# Patient Record
Sex: Male | Born: 1940 | Race: White | Hispanic: No | Marital: Married | Smoking: Former smoker
Health system: Southern US, Community
[De-identification: ages and names within clinical notes are randomized; demographics above are authoritative.]

## PROBLEM LIST (undated history)

## (undated) DIAGNOSIS — R413 Other amnesia: Secondary | ICD-10-CM

## (undated) DIAGNOSIS — Z87442 Personal history of urinary calculi: Secondary | ICD-10-CM

## (undated) DIAGNOSIS — L219 Seborrheic dermatitis, unspecified: Secondary | ICD-10-CM

## (undated) DIAGNOSIS — I10 Essential (primary) hypertension: Secondary | ICD-10-CM

## (undated) DIAGNOSIS — D75839 Thrombocytosis, unspecified: Secondary | ICD-10-CM

## (undated) DIAGNOSIS — D473 Essential (hemorrhagic) thrombocythemia: Secondary | ICD-10-CM

## (undated) DIAGNOSIS — J90 Pleural effusion, not elsewhere classified: Secondary | ICD-10-CM

## (undated) HISTORY — DX: Essential (primary) hypertension: I10

## (undated) HISTORY — PX: TONSILLECTOMY: SUR1361

## (undated) HISTORY — DX: Thrombocytosis, unspecified: D75.839

## (undated) HISTORY — DX: Seborrheic dermatitis, unspecified: L21.9

## (undated) HISTORY — PX: KIDNEY STONE SURGERY: SHX686

## (undated) HISTORY — DX: Essential (hemorrhagic) thrombocythemia: D47.3

## (undated) HISTORY — DX: Other amnesia: R41.3

---

## 1993-03-09 HISTORY — PX: COLECTOMY: SHX59

## 2009-07-17 ENCOUNTER — Encounter: Admission: RE | Admit: 2009-07-17 | Discharge: 2009-07-17 | Payer: Self-pay | Admitting: Family Medicine

## 2009-09-12 ENCOUNTER — Ambulatory Visit: Payer: Self-pay | Admitting: Hematology and Oncology

## 2009-09-17 LAB — CBC WITH DIFFERENTIAL/PLATELET
Basophils Absolute: 0 10*3/uL (ref 0.0–0.1)
Eosinophils Absolute: 0.1 10*3/uL (ref 0.0–0.5)
HGB: 15.3 g/dL (ref 13.0–17.1)
LYMPH%: 15.7 % (ref 14.0–49.0)
MCV: 89.1 fL (ref 79.3–98.0)
MONO%: 5.2 % (ref 0.0–14.0)
NEUT#: 7.6 10*3/uL — ABNORMAL HIGH (ref 1.5–6.5)
Platelets: 907 10*3/uL — ABNORMAL HIGH (ref 140–400)

## 2009-09-20 LAB — COMPREHENSIVE METABOLIC PANEL
AST: 18 U/L (ref 0–37)
Albumin: 4.6 g/dL (ref 3.5–5.2)
BUN: 17 mg/dL (ref 6–23)
Calcium: 10.3 mg/dL (ref 8.4–10.5)
Chloride: 103 mEq/L (ref 96–112)
Potassium: 4.5 mEq/L (ref 3.5–5.3)
Sodium: 140 mEq/L (ref 135–145)
Total Protein: 7.8 g/dL (ref 6.0–8.3)

## 2009-09-20 LAB — PROTEIN ELECTROPHORESIS, SERUM
Albumin ELP: 58 % (ref 55.8–66.1)
Alpha-1-Globulin: 4.1 % (ref 2.9–4.9)
Alpha-2-Globulin: 8.9 % (ref 7.1–11.8)
Beta Globulin: 6.8 % (ref 4.7–7.2)
Total Protein, Serum Electrophoresis: 7.8 g/dL (ref 6.0–8.3)

## 2009-09-20 LAB — IRON AND TIBC: UIBC: 242 ug/dL

## 2009-09-20 LAB — BCR/ABL (LIO MMD)

## 2009-09-26 ENCOUNTER — Other Ambulatory Visit: Admission: RE | Admit: 2009-09-26 | Discharge: 2009-09-26 | Payer: Self-pay | Admitting: Hematology and Oncology

## 2009-10-01 ENCOUNTER — Ambulatory Visit (HOSPITAL_COMMUNITY): Admission: RE | Admit: 2009-10-01 | Discharge: 2009-10-01 | Payer: Self-pay | Admitting: Hematology and Oncology

## 2009-10-03 LAB — CBC WITH DIFFERENTIAL/PLATELET
Basophils Absolute: 0 10*3/uL (ref 0.0–0.1)
Eosinophils Absolute: 0.3 10*3/uL (ref 0.0–0.5)
HCT: 40.9 % (ref 38.4–49.9)
HGB: 14.1 g/dL (ref 13.0–17.1)
LYMPH%: 24.3 % (ref 14.0–49.0)
MCV: 88.7 fL (ref 79.3–98.0)
MONO#: 0.6 10*3/uL (ref 0.1–0.9)
MONO%: 7 % (ref 0.0–14.0)
NEUT#: 5 10*3/uL (ref 1.5–6.5)
NEUT%: 64.3 % (ref 39.0–75.0)
Platelets: 857 10*3/uL — ABNORMAL HIGH (ref 140–400)

## 2009-10-10 LAB — CBC WITH DIFFERENTIAL/PLATELET
BASO%: 0.2 % (ref 0.0–2.0)
Basophils Absolute: 0 10*3/uL (ref 0.0–0.1)
EOS%: 0.9 % (ref 0.0–7.0)
Eosinophils Absolute: 0.1 10*3/uL (ref 0.0–0.5)
HCT: 40.7 % (ref 38.4–49.9)
HGB: 14.1 g/dL (ref 13.0–17.1)
LYMPH%: 26.6 % (ref 14.0–49.0)
MCH: 30.7 pg (ref 27.2–33.4)
MCHC: 34.7 g/dL (ref 32.0–36.0)
MCV: 88.6 fL (ref 79.3–98.0)
MONO#: 0.2 10*3/uL (ref 0.1–0.9)
MONO%: 2.5 % (ref 0.0–14.0)
NEUT#: 4.5 10*3/uL (ref 1.5–6.5)
NEUT%: 69.8 % (ref 39.0–75.0)
Platelets: 771 10*3/uL — ABNORMAL HIGH (ref 140–400)
RBC: 4.59 10*6/uL (ref 4.20–5.82)
RDW: 13.1 % (ref 11.0–14.6)
WBC: 6.4 10*3/uL (ref 4.0–10.3)
lymph#: 1.7 10*3/uL (ref 0.9–3.3)

## 2009-10-10 LAB — BASIC METABOLIC PANEL
Chloride: 105 mEq/L (ref 96–112)
Potassium: 3.7 mEq/L (ref 3.5–5.3)
Sodium: 140 mEq/L (ref 135–145)

## 2009-11-12 ENCOUNTER — Ambulatory Visit: Payer: Self-pay | Admitting: Hematology and Oncology

## 2009-11-13 LAB — CBC WITH DIFFERENTIAL/PLATELET
BASO%: 0.6 % (ref 0.0–2.0)
Basophils Absolute: 0 10*3/uL (ref 0.0–0.1)
EOS%: 0.9 % (ref 0.0–7.0)
Eosinophils Absolute: 0.1 10*3/uL (ref 0.0–0.5)
HCT: 39.8 % (ref 38.4–49.9)
HGB: 13.7 g/dL (ref 13.0–17.1)
LYMPH%: 27.6 % (ref 14.0–49.0)
MCH: 32.6 pg (ref 27.2–33.4)
MCHC: 34.4 g/dL (ref 32.0–36.0)
MCV: 94.8 fL (ref 79.3–98.0)
MONO#: 0.4 10*3/uL (ref 0.1–0.9)
MONO%: 5.6 % (ref 0.0–14.0)
NEUT#: 4.3 10*3/uL (ref 1.5–6.5)
NEUT%: 65.3 % (ref 39.0–75.0)
Platelets: 499 10*3/uL — ABNORMAL HIGH (ref 140–400)
RBC: 4.2 10*6/uL (ref 4.20–5.82)
RDW: 13.3 % (ref 11.0–14.6)
WBC: 6.6 10*3/uL (ref 4.0–10.3)
lymph#: 1.8 10*3/uL (ref 0.9–3.3)

## 2009-11-13 LAB — COMPREHENSIVE METABOLIC PANEL
ALT: 10 U/L (ref 0–53)
AST: 15 U/L (ref 0–37)
Albumin: 4.4 g/dL (ref 3.5–5.2)
Alkaline Phosphatase: 81 U/L (ref 39–117)
BUN: 18 mg/dL (ref 6–23)
CO2: 29 mEq/L (ref 19–32)
Calcium: 9.3 mg/dL (ref 8.4–10.5)
Chloride: 104 mEq/L (ref 96–112)
Creatinine, Ser: 1.19 mg/dL (ref 0.40–1.50)
Glucose, Bld: 97 mg/dL (ref 70–99)
Potassium: 4.3 mEq/L (ref 3.5–5.3)
Sodium: 140 mEq/L (ref 135–145)
Total Bilirubin: 0.6 mg/dL (ref 0.3–1.2)
Total Protein: 7.4 g/dL (ref 6.0–8.3)

## 2009-11-29 LAB — CBC WITH DIFFERENTIAL/PLATELET
BASO%: 0.5 % (ref 0.0–2.0)
Basophils Absolute: 0 10*3/uL (ref 0.0–0.1)
EOS%: 1.3 % (ref 0.0–7.0)
Eosinophils Absolute: 0.1 10*3/uL (ref 0.0–0.5)
HCT: 38.6 % (ref 38.4–49.9)
HGB: 13.3 g/dL (ref 13.0–17.1)
LYMPH%: 19.1 % (ref 14.0–49.0)
MCH: 33.6 pg — ABNORMAL HIGH (ref 27.2–33.4)
MCHC: 34.5 g/dL (ref 32.0–36.0)
MCV: 97.6 fL (ref 79.3–98.0)
MONO#: 0.3 10*3/uL (ref 0.1–0.9)
MONO%: 4.4 % (ref 0.0–14.0)
NEUT#: 5.3 10*3/uL (ref 1.5–6.5)
NEUT%: 74.7 % (ref 39.0–75.0)
Platelets: 570 10*3/uL — ABNORMAL HIGH (ref 140–400)
RBC: 3.95 10*6/uL — ABNORMAL LOW (ref 4.20–5.82)
RDW: 24.1 % — ABNORMAL HIGH (ref 11.0–14.6)
WBC: 7.1 10*3/uL (ref 4.0–10.3)
lymph#: 1.4 10*3/uL (ref 0.9–3.3)

## 2010-01-15 ENCOUNTER — Other Ambulatory Visit: Payer: Self-pay | Admitting: Hematology and Oncology

## 2010-01-15 LAB — BASIC METABOLIC PANEL
BUN: 14 mg/dL (ref 6–23)
Chloride: 104 mEq/L (ref 96–112)
Creatinine, Ser: 1.04 mg/dL (ref 0.40–1.50)
Glucose, Bld: 118 mg/dL — ABNORMAL HIGH (ref 70–99)
Potassium: 3.8 mEq/L (ref 3.5–5.3)

## 2010-01-15 LAB — CBC WITH DIFFERENTIAL/PLATELET
Basophils Absolute: 0 10*3/uL (ref 0.0–0.1)
Eosinophils Absolute: 0.1 10*3/uL (ref 0.0–0.5)
HCT: 39.1 % (ref 38.4–49.9)
HGB: 13.7 g/dL (ref 13.0–17.1)
MCH: 37.1 pg — ABNORMAL HIGH (ref 27.2–33.4)
MCV: 106.2 fL — ABNORMAL HIGH (ref 79.3–98.0)
NEUT#: 5.5 10*3/uL (ref 1.5–6.5)
NEUT%: 74.8 % (ref 39.0–75.0)
RDW: 17.3 % — ABNORMAL HIGH (ref 11.0–14.6)
lymph#: 1.4 10*3/uL (ref 0.9–3.3)

## 2010-02-05 ENCOUNTER — Other Ambulatory Visit: Payer: Self-pay | Admitting: Hematology and Oncology

## 2010-02-05 LAB — CBC WITH DIFFERENTIAL/PLATELET
BASO%: 0.5 % (ref 0.0–2.0)
EOS%: 1.5 % (ref 0.0–7.0)
Eosinophils Absolute: 0.1 10*3/uL (ref 0.0–0.5)
LYMPH%: 27.3 % (ref 14.0–49.0)
MCH: 37.8 pg — ABNORMAL HIGH (ref 27.2–33.4)
MCHC: 34.6 g/dL (ref 32.0–36.0)
MCV: 109.1 fL — ABNORMAL HIGH (ref 79.3–98.0)
MONO%: 6.7 % (ref 0.0–14.0)
Platelets: 493 10*3/uL — ABNORMAL HIGH (ref 140–400)
RBC: 3.58 10*6/uL — ABNORMAL LOW (ref 4.20–5.82)
RDW: 12.8 % (ref 11.0–14.6)

## 2010-03-17 ENCOUNTER — Ambulatory Visit: Payer: Self-pay | Admitting: Hematology and Oncology

## 2010-03-19 LAB — BASIC METABOLIC PANEL
BUN: 26 mg/dL — ABNORMAL HIGH (ref 6–23)
CO2: 30 mEq/L (ref 19–32)
Calcium: 10 mg/dL (ref 8.4–10.5)
Chloride: 106 mEq/L (ref 96–112)
Creatinine, Ser: 1.35 mg/dL (ref 0.40–1.50)
Glucose, Bld: 101 mg/dL — ABNORMAL HIGH (ref 70–99)
Potassium: 4.1 mEq/L (ref 3.5–5.3)
Sodium: 144 mEq/L (ref 135–145)

## 2010-03-19 LAB — CBC WITH DIFFERENTIAL/PLATELET
BASO%: 0.6 % (ref 0.0–2.0)
Basophils Absolute: 0 10*3/uL (ref 0.0–0.1)
EOS%: 1.4 % (ref 0.0–7.0)
Eosinophils Absolute: 0.1 10*3/uL (ref 0.0–0.5)
HCT: 40.8 % (ref 38.4–49.9)
HGB: 14 g/dL (ref 13.0–17.1)
LYMPH%: 32.2 % (ref 14.0–49.0)
MCH: 37 pg — ABNORMAL HIGH (ref 27.2–33.4)
MCHC: 34.3 g/dL (ref 32.0–36.0)
MCV: 107.7 fL — ABNORMAL HIGH (ref 79.3–98.0)
MONO#: 0.5 10*3/uL (ref 0.1–0.9)
MONO%: 7.3 % (ref 0.0–14.0)
NEUT#: 3.7 10*3/uL (ref 1.5–6.5)
NEUT%: 58.5 % (ref 39.0–75.0)
Platelets: 499 10*3/uL — ABNORMAL HIGH (ref 140–400)
RBC: 3.79 10*6/uL — ABNORMAL LOW (ref 4.20–5.82)
RDW: 13 % (ref 11.0–14.6)
WBC: 6.2 10*3/uL (ref 4.0–10.3)
lymph#: 2 10*3/uL (ref 0.9–3.3)

## 2010-04-16 ENCOUNTER — Encounter (HOSPITAL_BASED_OUTPATIENT_CLINIC_OR_DEPARTMENT_OTHER): Payer: MEDICARE | Admitting: Hematology and Oncology

## 2010-04-16 ENCOUNTER — Other Ambulatory Visit: Payer: Self-pay | Admitting: Hematology and Oncology

## 2010-04-16 DIAGNOSIS — D47Z9 Other specified neoplasms of uncertain behavior of lymphoid, hematopoietic and related tissue: Secondary | ICD-10-CM

## 2010-04-16 DIAGNOSIS — D473 Essential (hemorrhagic) thrombocythemia: Secondary | ICD-10-CM

## 2010-04-16 LAB — CBC WITH DIFFERENTIAL/PLATELET
BASO%: 0.1 % (ref 0.0–2.0)
Basophils Absolute: 0 10*3/uL (ref 0.0–0.1)
EOS%: 0.4 % (ref 0.0–7.0)
Eosinophils Absolute: 0 10*3/uL (ref 0.0–0.5)
HCT: 38.7 % (ref 38.4–49.9)
HGB: 13.2 g/dL (ref 13.0–17.1)
LYMPH%: 18.1 % (ref 14.0–49.0)
MCH: 37 pg — ABNORMAL HIGH (ref 27.2–33.4)
MCHC: 34.1 g/dL (ref 32.0–36.0)
MCV: 108.6 fL — ABNORMAL HIGH (ref 79.3–98.0)
MONO#: 0.4 10*3/uL (ref 0.1–0.9)
MONO%: 4.9 % (ref 0.0–14.0)
NEUT#: 5.7 10*3/uL (ref 1.5–6.5)
NEUT%: 76.5 % — ABNORMAL HIGH (ref 39.0–75.0)
Platelets: 325 10*3/uL (ref 140–400)
RBC: 3.56 10*6/uL — ABNORMAL LOW (ref 4.20–5.82)
RDW: 14.4 % (ref 11.0–14.6)
WBC: 7.5 10*3/uL (ref 4.0–10.3)
lymph#: 1.3 10*3/uL (ref 0.9–3.3)

## 2010-05-24 LAB — DIFFERENTIAL
Basophils Absolute: 0.2 10*3/uL — ABNORMAL HIGH (ref 0.0–0.1)
Eosinophils Relative: 4 % (ref 0–5)
Lymphocytes Relative: 25 % (ref 12–46)
Neutrophils Relative %: 62 % (ref 43–77)

## 2010-05-24 LAB — BONE MARROW EXAM

## 2010-05-24 LAB — CBC
MCV: 90 fL (ref 78.0–100.0)
Platelets: 851 10*3/uL — ABNORMAL HIGH (ref 150–400)
RDW: 13.3 % (ref 11.5–15.5)
WBC: 7.9 10*3/uL (ref 4.0–10.5)

## 2010-05-24 LAB — CHROMOSOME ANALYSIS, BONE MARROW

## 2010-06-18 ENCOUNTER — Other Ambulatory Visit: Payer: Self-pay | Admitting: Hematology and Oncology

## 2010-06-18 ENCOUNTER — Encounter (HOSPITAL_BASED_OUTPATIENT_CLINIC_OR_DEPARTMENT_OTHER): Payer: MEDICARE | Admitting: Hematology and Oncology

## 2010-06-18 DIAGNOSIS — D473 Essential (hemorrhagic) thrombocythemia: Secondary | ICD-10-CM

## 2010-06-18 DIAGNOSIS — D47Z9 Other specified neoplasms of uncertain behavior of lymphoid, hematopoietic and related tissue: Secondary | ICD-10-CM

## 2010-06-18 LAB — BASIC METABOLIC PANEL
BUN: 21 mg/dL (ref 6–23)
Chloride: 105 mEq/L (ref 96–112)
Potassium: 3.8 mEq/L (ref 3.5–5.3)
Sodium: 140 mEq/L (ref 135–145)

## 2010-06-18 LAB — CBC WITH DIFFERENTIAL/PLATELET
Basophils Absolute: 0 10*3/uL (ref 0.0–0.1)
EOS%: 1 % (ref 0.0–7.0)
HGB: 12.5 g/dL — ABNORMAL LOW (ref 13.0–17.1)
MCH: 39.8 pg — ABNORMAL HIGH (ref 27.2–33.4)
NEUT#: 3 10*3/uL (ref 1.5–6.5)
RBC: 3.13 10*6/uL — ABNORMAL LOW (ref 4.20–5.82)
RDW: 14.3 % (ref 11.0–14.6)
lymph#: 1.2 10*3/uL (ref 0.9–3.3)

## 2010-07-30 ENCOUNTER — Other Ambulatory Visit: Payer: Self-pay | Admitting: Hematology and Oncology

## 2010-07-30 ENCOUNTER — Encounter (HOSPITAL_BASED_OUTPATIENT_CLINIC_OR_DEPARTMENT_OTHER): Payer: Medicare Other | Admitting: Hematology and Oncology

## 2010-07-30 DIAGNOSIS — D473 Essential (hemorrhagic) thrombocythemia: Secondary | ICD-10-CM

## 2010-07-30 DIAGNOSIS — D47Z9 Other specified neoplasms of uncertain behavior of lymphoid, hematopoietic and related tissue: Secondary | ICD-10-CM

## 2010-07-30 LAB — CBC WITH DIFFERENTIAL/PLATELET
Basophils Absolute: 0 10*3/uL (ref 0.0–0.1)
Eosinophils Absolute: 0.1 10*3/uL (ref 0.0–0.5)
HCT: 36.9 % — ABNORMAL LOW (ref 38.4–49.9)
HGB: 12.9 g/dL — ABNORMAL LOW (ref 13.0–17.1)
LYMPH%: 29.6 % (ref 14.0–49.0)
MCV: 113 fL — ABNORMAL HIGH (ref 79.3–98.0)
MONO#: 0.4 10*3/uL (ref 0.1–0.9)
MONO%: 7 % (ref 0.0–14.0)
NEUT#: 3.5 10*3/uL (ref 1.5–6.5)
NEUT%: 61.7 % (ref 39.0–75.0)
Platelets: 307 10*3/uL (ref 140–400)
WBC: 5.6 10*3/uL (ref 4.0–10.3)

## 2010-08-27 ENCOUNTER — Other Ambulatory Visit: Payer: Self-pay | Admitting: Hematology and Oncology

## 2010-08-27 ENCOUNTER — Encounter (HOSPITAL_BASED_OUTPATIENT_CLINIC_OR_DEPARTMENT_OTHER): Payer: Medicare Other | Admitting: Hematology and Oncology

## 2010-08-27 DIAGNOSIS — D47Z9 Other specified neoplasms of uncertain behavior of lymphoid, hematopoietic and related tissue: Secondary | ICD-10-CM

## 2010-08-27 DIAGNOSIS — D473 Essential (hemorrhagic) thrombocythemia: Secondary | ICD-10-CM

## 2010-08-27 LAB — CBC WITH DIFFERENTIAL/PLATELET
BASO%: 0.5 % (ref 0.0–2.0)
HCT: 37.9 % — ABNORMAL LOW (ref 38.4–49.9)
LYMPH%: 23.9 % (ref 14.0–49.0)
MCHC: 34.6 g/dL (ref 32.0–36.0)
MCV: 112.1 fL — ABNORMAL HIGH (ref 79.3–98.0)
MONO#: 0.4 10*3/uL (ref 0.1–0.9)
MONO%: 7.7 % (ref 0.0–14.0)
NEUT%: 67.3 % (ref 39.0–75.0)
Platelets: 304 10*3/uL (ref 140–400)
RBC: 3.38 10*6/uL — ABNORMAL LOW (ref 4.20–5.82)

## 2010-09-23 ENCOUNTER — Other Ambulatory Visit: Payer: Self-pay | Admitting: Hematology and Oncology

## 2010-09-23 ENCOUNTER — Encounter (HOSPITAL_BASED_OUTPATIENT_CLINIC_OR_DEPARTMENT_OTHER): Payer: Medicare Other | Admitting: Hematology and Oncology

## 2010-09-23 DIAGNOSIS — D473 Essential (hemorrhagic) thrombocythemia: Secondary | ICD-10-CM

## 2010-09-23 DIAGNOSIS — D47Z9 Other specified neoplasms of uncertain behavior of lymphoid, hematopoietic and related tissue: Secondary | ICD-10-CM

## 2010-09-23 LAB — CBC WITH DIFFERENTIAL/PLATELET
BASO%: 0.3 % (ref 0.0–2.0)
EOS%: 0.7 % (ref 0.0–7.0)
HCT: 36.6 % — ABNORMAL LOW (ref 38.4–49.9)
LYMPH%: 24.4 % (ref 14.0–49.0)
MCH: 39.3 pg — ABNORMAL HIGH (ref 27.2–33.4)
MCHC: 34.7 g/dL (ref 32.0–36.0)
MONO#: 0.4 10*3/uL (ref 0.1–0.9)
NEUT%: 68 % (ref 39.0–75.0)
Platelets: 266 10*3/uL (ref 140–400)
RBC: 3.23 10*6/uL — ABNORMAL LOW (ref 4.20–5.82)
WBC: 5.8 10*3/uL (ref 4.0–10.3)
lymph#: 1.4 10*3/uL (ref 0.9–3.3)

## 2010-09-23 LAB — BASIC METABOLIC PANEL
Calcium: 9.2 mg/dL (ref 8.4–10.5)
Creatinine, Ser: 1.21 mg/dL (ref 0.50–1.35)
Sodium: 140 mEq/L (ref 135–145)

## 2010-10-29 ENCOUNTER — Encounter (HOSPITAL_BASED_OUTPATIENT_CLINIC_OR_DEPARTMENT_OTHER): Payer: Medicare Other | Admitting: Hematology and Oncology

## 2010-10-29 ENCOUNTER — Other Ambulatory Visit: Payer: Self-pay | Admitting: Hematology and Oncology

## 2010-10-29 DIAGNOSIS — D47Z9 Other specified neoplasms of uncertain behavior of lymphoid, hematopoietic and related tissue: Secondary | ICD-10-CM

## 2010-10-29 DIAGNOSIS — D473 Essential (hemorrhagic) thrombocythemia: Secondary | ICD-10-CM

## 2010-10-29 LAB — CBC WITH DIFFERENTIAL/PLATELET
Basophils Absolute: 0 10*3/uL (ref 0.0–0.1)
Eosinophils Absolute: 0 10*3/uL (ref 0.0–0.5)
HCT: 37.1 % — ABNORMAL LOW (ref 38.4–49.9)
HGB: 13 g/dL (ref 13.0–17.1)
MONO#: 0.4 10*3/uL (ref 0.1–0.9)
NEUT#: 2.8 10*3/uL (ref 1.5–6.5)
NEUT%: 58.5 % (ref 39.0–75.0)
RDW: 13.6 % (ref 11.0–14.6)
lymph#: 1.5 10*3/uL (ref 0.9–3.3)

## 2010-12-23 ENCOUNTER — Other Ambulatory Visit: Payer: Self-pay | Admitting: Hematology and Oncology

## 2010-12-23 ENCOUNTER — Encounter (HOSPITAL_BASED_OUTPATIENT_CLINIC_OR_DEPARTMENT_OTHER): Payer: Medicare Other | Admitting: Hematology and Oncology

## 2010-12-23 DIAGNOSIS — D473 Essential (hemorrhagic) thrombocythemia: Secondary | ICD-10-CM

## 2010-12-23 DIAGNOSIS — D47Z9 Other specified neoplasms of uncertain behavior of lymphoid, hematopoietic and related tissue: Secondary | ICD-10-CM

## 2010-12-23 DIAGNOSIS — D649 Anemia, unspecified: Secondary | ICD-10-CM

## 2010-12-23 LAB — CBC WITH DIFFERENTIAL/PLATELET
Basophils Absolute: 0 10*3/uL (ref 0.0–0.1)
Eosinophils Absolute: 0.1 10*3/uL (ref 0.0–0.5)
HCT: 37.1 % — ABNORMAL LOW (ref 38.4–49.9)
HGB: 12.9 g/dL — ABNORMAL LOW (ref 13.0–17.1)
MCV: 112.3 fL — ABNORMAL HIGH (ref 79.3–98.0)
MONO%: 8.9 % (ref 0.0–14.0)
NEUT#: 3.5 10*3/uL (ref 1.5–6.5)
NEUT%: 66.7 % (ref 39.0–75.0)
Platelets: 295 10*3/uL (ref 140–400)
RDW: 12.6 % (ref 11.0–14.6)

## 2010-12-23 LAB — BASIC METABOLIC PANEL
BUN: 19 mg/dL (ref 6–23)
Creatinine, Ser: 1.18 mg/dL (ref 0.50–1.35)
Glucose, Bld: 114 mg/dL — ABNORMAL HIGH (ref 70–99)
Potassium: 4 mEq/L (ref 3.5–5.3)

## 2011-02-04 ENCOUNTER — Other Ambulatory Visit: Payer: Medicare Other | Admitting: Lab

## 2011-02-04 ENCOUNTER — Telehealth: Payer: Self-pay | Admitting: Hematology and Oncology

## 2011-02-04 NOTE — Telephone Encounter (Signed)
Pt called today to r/s 11/28 lb to 12/5 - date per pt. Pt also given jan appts for 1/16.

## 2011-02-11 ENCOUNTER — Other Ambulatory Visit: Payer: Self-pay | Admitting: Hematology and Oncology

## 2011-02-11 ENCOUNTER — Other Ambulatory Visit (HOSPITAL_BASED_OUTPATIENT_CLINIC_OR_DEPARTMENT_OTHER): Payer: Medicare Other

## 2011-02-11 ENCOUNTER — Telehealth: Payer: Self-pay | Admitting: *Deleted

## 2011-02-11 DIAGNOSIS — D473 Essential (hemorrhagic) thrombocythemia: Secondary | ICD-10-CM

## 2011-02-11 LAB — CBC WITH DIFFERENTIAL/PLATELET
EOS%: 0.6 % (ref 0.0–7.0)
MCHC: 34.8 g/dL (ref 32.0–36.0)
MCV: 113.6 fL — ABNORMAL HIGH (ref 79.3–98.0)
MONO#: 0.7 10*3/uL (ref 0.1–0.9)
Platelets: 303 10*3/uL (ref 140–400)
RBC: 3.24 10*6/uL — ABNORMAL LOW (ref 4.20–5.82)
WBC: 7.8 10*3/uL (ref 4.0–10.3)

## 2011-02-11 NOTE — Telephone Encounter (Signed)
Dr. Dalene Carrow reviewed lab results today.   Spoke with pt and instructed pt to continue with  Hydrea  1000 mg  Po daily as per md.    Confirmed date and time for lab and f/u appt on  03/25/11.     Pt voiced understanding.

## 2011-03-23 ENCOUNTER — Encounter: Payer: Self-pay | Admitting: *Deleted

## 2011-03-25 ENCOUNTER — Other Ambulatory Visit (HOSPITAL_BASED_OUTPATIENT_CLINIC_OR_DEPARTMENT_OTHER): Payer: Medicare Other | Admitting: Lab

## 2011-03-25 ENCOUNTER — Ambulatory Visit (HOSPITAL_BASED_OUTPATIENT_CLINIC_OR_DEPARTMENT_OTHER): Payer: Medicare Other | Admitting: Hematology and Oncology

## 2011-03-25 ENCOUNTER — Telehealth: Payer: Self-pay | Admitting: Hematology and Oncology

## 2011-03-25 VITALS — BP 153/75 | HR 80 | Temp 98.3°F | Ht 70.5 in | Wt 209.6 lb

## 2011-03-25 DIAGNOSIS — D473 Essential (hemorrhagic) thrombocythemia: Secondary | ICD-10-CM

## 2011-03-25 LAB — CBC WITH DIFFERENTIAL/PLATELET
Basophils Absolute: 0 10*3/uL (ref 0.0–0.1)
EOS%: 0.9 % (ref 0.0–7.0)
Eosinophils Absolute: 0 10*3/uL (ref 0.0–0.5)
HCT: 38.5 % (ref 38.4–49.9)
HGB: 13.5 g/dL (ref 13.0–17.1)
LYMPH%: 25.8 % (ref 14.0–49.0)
MCH: 39.7 pg — ABNORMAL HIGH (ref 27.2–33.4)
MCV: 113.6 fL — ABNORMAL HIGH (ref 79.3–98.0)
MONO%: 6 % (ref 0.0–14.0)
NEUT#: 3.6 10*3/uL (ref 1.5–6.5)
NEUT%: 66.9 % (ref 39.0–75.0)
Platelets: 307 10*3/uL (ref 140–400)
RDW: 13.6 % (ref 11.0–14.6)

## 2011-03-25 LAB — BASIC METABOLIC PANEL
BUN: 15 mg/dL (ref 6–23)
Calcium: 9.5 mg/dL (ref 8.4–10.5)
Creatinine, Ser: 1.15 mg/dL (ref 0.50–1.35)
Glucose, Bld: 114 mg/dL — ABNORMAL HIGH (ref 70–99)

## 2011-03-25 NOTE — Progress Notes (Signed)
This office note has been dictated.

## 2011-03-25 NOTE — Telephone Encounter (Signed)
Gv pt appt for march-july2013

## 2011-03-25 NOTE — Progress Notes (Signed)
CC:   Anna Genre. Little, M.D.  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  INTERVAL HISTORY:  Jordan Potts has had labs drawn monthly since he was last seen in October and essentially he has had no changes to his blood counts.  He is tolerating Hydrea with very minimal difficulties.  Today's CBC on 03/25/2011 notes a white cell count of 5.3, hemoglobin 13.5, hematocrit 38.5, platelets 307.  MEDICATIONS:  Hydrea 1000 mg daily.  PHYSICAL EXAMINATION:  General:  Alert and oriented x3.  Vitals:  Pulse 80, blood pressure 153/75, temperature 98.3, respirations 20, weight 209 pounds.  HEENT:  Head is atraumatic, normocephalic.  Sclerae anicteric. Mouth:  No thrush.  Chest:  Clear.  CVS:  Unremarkable.  Abdomen:  Soft and nontender.  Bowel sounds present.  Extremities:  No edema.  Pulses present and symmetrical.  LABORATORY DATA:  CBC as above.  BMET pending.  IMPRESSION AND PLAN:  Jordan Potts is a 71 year old man with chronic myeloproliferative disorder/essential thrombocytosis who is currently maintained on Hydrea 1000 mg p.o. daily.  He continues to demonstrate stable lab data.  He is asymptomatic.  With this said, we will space out his lab work to every 2 months and he is scheduled to follow up in 6 months' time or sooner if needed.    ______________________________ Laurice Record, M.D. LIO/MEDQ  D:  03/25/2011  T:  03/25/2011  Job:  [id number]

## 2011-05-22 ENCOUNTER — Other Ambulatory Visit (HOSPITAL_BASED_OUTPATIENT_CLINIC_OR_DEPARTMENT_OTHER): Payer: Medicare Other | Admitting: Lab

## 2011-05-22 ENCOUNTER — Telehealth: Payer: Self-pay | Admitting: Nurse Practitioner

## 2011-05-22 DIAGNOSIS — D473 Essential (hemorrhagic) thrombocythemia: Secondary | ICD-10-CM

## 2011-05-22 LAB — CBC WITH DIFFERENTIAL/PLATELET
BASO%: 0.4 % (ref 0.0–2.0)
Eosinophils Absolute: 0 10*3/uL (ref 0.0–0.5)
LYMPH%: 26 % (ref 14.0–49.0)
MCHC: 34.9 g/dL (ref 32.0–36.0)
MONO#: 0.3 10*3/uL (ref 0.1–0.9)
NEUT#: 4 10*3/uL (ref 1.5–6.5)
Platelets: 321 10*3/uL (ref 140–400)
RBC: 3.48 10*6/uL — ABNORMAL LOW (ref 4.20–5.82)
RDW: 13.2 % (ref 11.0–14.6)
WBC: 6 10*3/uL (ref 4.0–10.3)

## 2011-05-22 NOTE — Telephone Encounter (Signed)
Left message for patient to call office re: labs today.  Need to inform pt:  Continue with Hydrea 1000mg  daily. Recheck CBC as scheduled on 5/16.

## 2011-05-26 ENCOUNTER — Other Ambulatory Visit: Payer: Self-pay | Admitting: Hematology and Oncology

## 2011-05-26 DIAGNOSIS — D473 Essential (hemorrhagic) thrombocythemia: Secondary | ICD-10-CM

## 2011-07-23 ENCOUNTER — Telehealth: Payer: Self-pay | Admitting: *Deleted

## 2011-07-23 ENCOUNTER — Other Ambulatory Visit (HOSPITAL_BASED_OUTPATIENT_CLINIC_OR_DEPARTMENT_OTHER): Payer: Medicare Other | Admitting: Lab

## 2011-07-23 DIAGNOSIS — D473 Essential (hemorrhagic) thrombocythemia: Secondary | ICD-10-CM

## 2011-07-23 LAB — CBC WITH DIFFERENTIAL/PLATELET
BASO%: 0.6 % (ref 0.0–2.0)
Eosinophils Absolute: 0.1 10*3/uL (ref 0.0–0.5)
HCT: 36 % — ABNORMAL LOW (ref 38.4–49.9)
MCHC: 35.5 g/dL (ref 32.0–36.0)
MONO#: 0.4 10*3/uL (ref 0.1–0.9)
NEUT#: 3.5 10*3/uL (ref 1.5–6.5)
Platelets: 282 10*3/uL (ref 140–400)
RBC: 3.25 10*6/uL — ABNORMAL LOW (ref 4.20–5.82)
WBC: 5.5 10*3/uL (ref 4.0–10.3)
lymph#: 1.5 10*3/uL (ref 0.9–3.3)
nRBC: 0 % (ref 0–0)

## 2011-07-23 NOTE — Telephone Encounter (Signed)
Dr. Arline Asp reviewed lab results today.   Spoke with pt at home and instructed pt to continue with  Hydrea  1000 mg  Po daily  As per md.   Confirmed date and time for next f/u visit on 09/22/11.    Pt voiced understanding.

## 2011-09-22 ENCOUNTER — Other Ambulatory Visit (HOSPITAL_BASED_OUTPATIENT_CLINIC_OR_DEPARTMENT_OTHER): Payer: Medicare Other | Admitting: Lab

## 2011-09-22 ENCOUNTER — Telehealth: Payer: Self-pay | Admitting: Hematology and Oncology

## 2011-09-22 ENCOUNTER — Encounter: Payer: Self-pay | Admitting: Hematology and Oncology

## 2011-09-22 ENCOUNTER — Ambulatory Visit (HOSPITAL_BASED_OUTPATIENT_CLINIC_OR_DEPARTMENT_OTHER): Payer: Medicare Other | Admitting: Hematology and Oncology

## 2011-09-22 VITALS — BP 141/76 | HR 75 | Temp 97.2°F | Ht 70.5 in | Wt 205.1 lb

## 2011-09-22 DIAGNOSIS — N200 Calculus of kidney: Secondary | ICD-10-CM | POA: Insufficient documentation

## 2011-09-22 DIAGNOSIS — D473 Essential (hemorrhagic) thrombocythemia: Secondary | ICD-10-CM

## 2011-09-22 DIAGNOSIS — D45 Polycythemia vera: Secondary | ICD-10-CM

## 2011-09-22 LAB — CBC WITH DIFFERENTIAL/PLATELET
Basophils Absolute: 0 10*3/uL (ref 0.0–0.1)
Eosinophils Absolute: 0.1 10*3/uL (ref 0.0–0.5)
HCT: 38.6 % (ref 38.4–49.9)
HGB: 13.7 g/dL (ref 13.0–17.1)
MONO#: 0.4 10*3/uL (ref 0.1–0.9)
NEUT#: 4.1 10*3/uL (ref 1.5–6.5)
NEUT%: 65.9 % (ref 39.0–75.0)
RDW: 13.7 % (ref 11.0–14.6)
lymph#: 1.6 10*3/uL (ref 0.9–3.3)

## 2011-09-22 NOTE — Patient Instructions (Addendum)
Jordan Potts  [id number]  [facility] Discharge Instructions  RECOMMENDATIONS MADE BY THE CONSULTANT AND ANY TEST RESULTS WILL BE SENT TO YOUR REFERRING DOCTOR.   EXAM FINDINGS BY MD TODAY AND SIGNS AND SYMPTOMS TO REPORT TO CLINIC OR PRIMARY MD:   Your current list of medications are: Current Outpatient Prescriptions  Medication Sig Dispense Refill  . acetaminophen (TYLENOL) 500 MG tablet Take 1,000 mg by mouth as needed.      Marland Kitchen amLODipine (NORVASC) 5 MG tablet Take 5 mg by mouth daily.      Marland Kitchen aspirin 81 MG tablet Take 81 mg by mouth daily.      . hydroxyurea (HYDREA) 500 MG capsule TAKE TWO CAPSULES BY MOUTH EVERY DAY  60 capsule  4  . valsartan-hydrochlorothiazide (DIOVAN-HCT) 320-25 MG per tablet Take 1 tablet by mouth daily.         INSTRUCTIONS GIVEN AND DISCUSSED:   SPECIAL INSTRUCTIONS/FOLLOW-UP:  See above.  I acknowledge that I have been informed and understand all the instructions given to me and received a copy. I do not have any more questions at this time, but understand that I may call the [facility] at [phone] during business hours should I have any further questions or need assistance in obtaining follow-up care.

## 2011-09-22 NOTE — Telephone Encounter (Signed)
Gave pt appt for lab every 3 months and see MD in March 2014 with labs

## 2011-09-22 NOTE — Progress Notes (Signed)
CC:   Anna Genre. Little, M.D.  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  INTERVAL HISTORY:  Mr. Caldron was last seen 6 months ago.  He has had no issues or concerns since his last followup visit.  We have closely monitored his CBCs and we have not had to make any adjustments in Hydrea dosage.  He denies nausea, vomiting, abdominal pain, diarrhea, melena, or hematochezia.  MEDICATIONS:  Hydrea 1000 mg daily.  PHYSICAL EXAMINATION:  General:  The patient is alert and oriented x3. Vitals:  Pulse 75, blood pressure 141/76, temperature 97.2, respirations 20, weight 205.1 pounds.  HEENT:  Head is atraumatic, normocephalic. Sclerae are anicteric.  Mouth moist.  Neck:  Supple.  Chest:  Clear to percussion and auscultation.  CVS:  Unremarkable.  Abdomen:  Soft, nontender.  Bowel sounds present.  Extremities:  No edema.  LABORATORY DATA:  On 09/22/2011 white cell count 6.1, hemoglobin 13.7, hematocrit 38.6, platelets 216.  IMPRESSION AND PLAN:  Mr. Want is a 71 year old man with chronic myeloproliferative disorder/essential thrombocytosis, who is currently maintained on Hydrea 1000 mg p.o. daily.  Labs remain stable.  He remains asymptomatic.  He will schedule every 3 months for now. He follows up in 9 months' time or sooner if needed.    ______________________________ Laurice Record, M.D. LIO/MEDQ  D:  09/22/2011  T:  09/22/2011  Job:  [id number]

## 2011-09-22 NOTE — Progress Notes (Signed)
This office note has been dictated.

## 2011-10-05 ENCOUNTER — Other Ambulatory Visit: Payer: Self-pay | Admitting: *Deleted

## 2011-11-24 ENCOUNTER — Other Ambulatory Visit: Payer: Self-pay | Admitting: Hematology and Oncology

## 2011-11-24 DIAGNOSIS — D471 Chronic myeloproliferative disease: Secondary | ICD-10-CM

## 2011-12-23 ENCOUNTER — Telehealth: Payer: Self-pay | Admitting: *Deleted

## 2011-12-23 ENCOUNTER — Other Ambulatory Visit (HOSPITAL_BASED_OUTPATIENT_CLINIC_OR_DEPARTMENT_OTHER): Payer: Medicare Other | Admitting: Lab

## 2011-12-23 DIAGNOSIS — D45 Polycythemia vera: Secondary | ICD-10-CM

## 2011-12-23 LAB — CBC WITH DIFFERENTIAL/PLATELET
Basophils Absolute: 0 10*3/uL (ref 0.0–0.1)
Eosinophils Absolute: 0 10*3/uL (ref 0.0–0.5)
HCT: 38.7 % (ref 38.4–49.9)
HGB: 13.7 g/dL (ref 13.0–17.1)
LYMPH%: 29.4 % (ref 14.0–49.0)
MCV: 108.8 fL — ABNORMAL HIGH (ref 79.3–98.0)
MONO%: 6.7 % (ref 0.0–14.0)
NEUT#: 3.3 10*3/uL (ref 1.5–6.5)
NEUT%: 62.3 % (ref 39.0–75.0)
Platelets: 400 10*3/uL (ref 140–400)
RDW: 12.2 % (ref 11.0–14.6)

## 2011-12-23 NOTE — Telephone Encounter (Signed)
Called pt at home and left message on voice mail re:  Asked pt to call nurse back for further instructions from lab results today.

## 2011-12-24 ENCOUNTER — Telehealth: Payer: Self-pay | Admitting: *Deleted

## 2011-12-24 NOTE — Telephone Encounter (Signed)
Pt returned nurse call from yesterday.   Called pt and informed pt re:  Platelets stable ;  Continue with Hydrea 1000 mg  Daily as per md.    Pt has appt for lab on  03/25/11.  Pt voiced understanding.

## 2012-03-24 ENCOUNTER — Other Ambulatory Visit (HOSPITAL_BASED_OUTPATIENT_CLINIC_OR_DEPARTMENT_OTHER): Payer: Medicare Other | Admitting: Lab

## 2012-03-24 ENCOUNTER — Telehealth: Payer: Self-pay | Admitting: Oncology

## 2012-03-24 DIAGNOSIS — D45 Polycythemia vera: Secondary | ICD-10-CM

## 2012-03-24 LAB — CBC WITH DIFFERENTIAL/PLATELET
Eosinophils Absolute: 0.1 10*3/uL (ref 0.0–0.5)
LYMPH%: 24.5 % (ref 14.0–49.0)
MCH: 38 pg — ABNORMAL HIGH (ref 27.2–33.4)
MCV: 106.2 fL — ABNORMAL HIGH (ref 79.3–98.0)
MONO%: 6.8 % (ref 0.0–14.0)
NEUT#: 5.3 10*3/uL (ref 1.5–6.5)
Platelets: 303 10*3/uL (ref 140–400)
RBC: 3.65 10*6/uL — ABNORMAL LOW (ref 4.20–5.82)

## 2012-03-24 NOTE — Telephone Encounter (Signed)
Pt came in today for a lab appt and was informed about the provider change. Pt will see Annice Pih 03/20 @ 9:30.

## 2012-03-29 ENCOUNTER — Other Ambulatory Visit: Payer: Self-pay | Admitting: Hematology and Oncology

## 2012-05-24 ENCOUNTER — Telehealth: Payer: Self-pay | Admitting: Oncology

## 2012-05-24 NOTE — Telephone Encounter (Signed)
Pt called to r/s appt to 4/3 @ 9:30.  New calendar mailed.

## 2012-05-25 ENCOUNTER — Ambulatory Visit: Payer: Medicare Other | Admitting: Hematology and Oncology

## 2012-05-25 ENCOUNTER — Other Ambulatory Visit: Payer: Medicare Other | Admitting: Lab

## 2012-05-26 ENCOUNTER — Ambulatory Visit: Payer: Medicare Other | Admitting: Family

## 2012-05-26 ENCOUNTER — Other Ambulatory Visit: Payer: Medicare Other | Admitting: Lab

## 2012-06-03 ENCOUNTER — Telehealth: Payer: Self-pay | Admitting: Oncology

## 2012-06-03 NOTE — Telephone Encounter (Signed)
Former LO pt to DM. Moved [facility] 4/3 to SW 4/4. S/w pt he is aware.

## 2012-06-09 ENCOUNTER — Other Ambulatory Visit: Payer: Self-pay | Admitting: Medical Oncology

## 2012-06-09 ENCOUNTER — Other Ambulatory Visit: Payer: Self-pay | Admitting: Oncology

## 2012-06-09 ENCOUNTER — Other Ambulatory Visit: Payer: Medicare Other | Admitting: Lab

## 2012-06-09 ENCOUNTER — Ambulatory Visit: Payer: Medicare Other | Admitting: Family

## 2012-06-09 DIAGNOSIS — D473 Essential (hemorrhagic) thrombocythemia: Secondary | ICD-10-CM

## 2012-06-09 DIAGNOSIS — D45 Polycythemia vera: Secondary | ICD-10-CM

## 2012-06-10 ENCOUNTER — Other Ambulatory Visit (HOSPITAL_BASED_OUTPATIENT_CLINIC_OR_DEPARTMENT_OTHER): Payer: Medicare Other | Admitting: Lab

## 2012-06-10 ENCOUNTER — Ambulatory Visit (HOSPITAL_BASED_OUTPATIENT_CLINIC_OR_DEPARTMENT_OTHER): Payer: Medicare Other | Admitting: Physician Assistant

## 2012-06-10 ENCOUNTER — Telehealth: Payer: Self-pay | Admitting: Oncology

## 2012-06-10 VITALS — BP 152/80 | HR 73 | Temp 98.1°F | Resp 18 | Ht 70.5 in | Wt 204.9 lb

## 2012-06-10 DIAGNOSIS — D473 Essential (hemorrhagic) thrombocythemia: Secondary | ICD-10-CM

## 2012-06-10 DIAGNOSIS — D45 Polycythemia vera: Secondary | ICD-10-CM

## 2012-06-10 DIAGNOSIS — D47Z9 Other specified neoplasms of uncertain behavior of lymphoid, hematopoietic and related tissue: Secondary | ICD-10-CM

## 2012-06-10 LAB — COMPREHENSIVE METABOLIC PANEL (CC13)
ALT: 22 U/L (ref 0–55)
Alkaline Phosphatase: 80 U/L (ref 40–150)
Sodium: 142 mEq/L (ref 136–145)
Total Bilirubin: 0.71 mg/dL (ref 0.20–1.20)
Total Protein: 7.1 g/dL (ref 6.4–8.3)

## 2012-06-10 LAB — CBC WITH DIFFERENTIAL/PLATELET
BASO%: 0.5 % (ref 0.0–2.0)
Basophils Absolute: 0 10*3/uL (ref 0.0–0.1)
EOS%: 0.7 % (ref 0.0–7.0)
Eosinophils Absolute: 0 10*3/uL (ref 0.0–0.5)
HCT: 39.5 % (ref 38.4–49.9)
HGB: 13.7 g/dL (ref 13.0–17.1)
LYMPH%: 30.5 % (ref 14.0–49.0)
MCH: 37.5 pg — ABNORMAL HIGH (ref 27.2–33.4)
MCHC: 34.6 g/dL (ref 32.0–36.0)
MCV: 108.4 fL — ABNORMAL HIGH (ref 79.3–98.0)
MONO#: 0.4 10*3/uL (ref 0.1–0.9)
MONO%: 8.6 % (ref 0.0–14.0)
NEUT#: 3 10*3/uL (ref 1.5–6.5)
NEUT%: 59.7 % (ref 39.0–75.0)
Platelets: 284 10*3/uL (ref 140–400)
RBC: 3.65 10*6/uL — ABNORMAL LOW (ref 4.20–5.82)
RDW: 13 % (ref 11.0–14.6)
WBC: 5 10*3/uL (ref 4.0–10.3)
lymph#: 1.5 10*3/uL (ref 0.9–3.3)

## 2012-06-10 NOTE — Telephone Encounter (Signed)
Gave pt appt for lab on July and MD visit on October 2014 with labs per Marlowe Kays PA.

## 2012-06-10 NOTE — Progress Notes (Signed)
[facility]  [phone]    OFFICE PROGRESS NOTE CC:   Caryn Bee L. Little, M.D.  IDENTIFYING STATEMENT:   1. 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 Bone marrow biopsy demonstrated myeloproliferative the disease on 09/26/2009. His platelet counts at that time were 907,000 and, as of 09/07/2009. JAK2 V1617F was negative and  BCR ABL negative as well. Ultrasound of the abdomen during the period of time revealed splenomegaly, at 521 cc.  The patient was placed on hydroxyurea, initially at 2000 mg a day currently, at 1000 mg a day with good results. The patient was being followed by Dr. Dalene Carrow, since 09/17/2009, now transferred to Dr. Arline Asp as of 06/10/12 2. History of kidney stones  3. History of diverticulitis, status post colectomy in the year 2000 4. Hypertension 5. LAD calcification on CT of the chest on 07/17/09    MEDICATIONS:  Current Outpatient Prescriptions  Medication Sig Dispense Refill  . acetaminophen (TYLENOL) 500 MG tablet Take 1,000 mg by mouth as needed.      Marland Kitchen amLODipine (NORVASC) 5 MG tablet Take 5 mg by mouth daily.      Marland Kitchen aspirin 81 MG tablet Take 81 mg by mouth daily.      . hydroxyurea (HYDREA) 500 MG capsule TAKE TWO CAPSULES BY MOUTH EVERY DAY  60 capsule  2  . valsartan-hydrochlorothiazide (DIOVAN-HCT) 320-25 MG per tablet Take 1 tablet by mouth daily.       No current facility-administered medications for this visit.    ALLERGIES:  No Known Allergies  INTERVAL HISTORY:  Mr. Haynesworth was last seen on 09/22/11. He has had no issues or concerns since his last followup visit.  We have closely monitored his CBCs and we have not had to make any adjustments in Hydrea dosage.  He denies nausea, vomiting, abdominal pain, diarrhea,melena, or hematochezia. He is tolerating the medication well. He is up today with his yearly physicals and with his vaccinations.    PHYSICAL EXAMINATION:    Filed Vitals:   06/10/12 1004  BP: 152/80  Pulse: 73  Temp: 98.1 F (36.7 C)  Resp: 18   Filed Weights   06/10/12 1004  Weight: 204 lb 14.4 oz (92.942 kg)   General:  The patient is alert and oriented x3.Vitals:   HEENT:  Head is atraumatic, normocephalic.Sclerae are anicteric.  Mouth moist.  Neck:  Supple.  Chest:  Clear topercussion and auscultation.  CVS:  Unremarkable.  Abdomen:  Soft,nontender.  Bowel sounds present.  Extremities:  No edema.   LABORATORY/RADIOLOGY DATA:   Recent Labs Lab 06/10/12 0935  WBC 5.0  HGB 13.7  HCT 39.5  PLT 284  MCV 108.4*  MCH 37.5*  MCHC 34.6  RDW 13.0  LYMPHSABS 1.5  MONOABS 0.4  EOSABS 0.0  BASOSABS 0.0    CMP   No results found for this basename: NA, K, CL, CO2, GLUCOSE, BUN, CREATININE, GFRCGP, CALCIUM, MG, AST, ALT, ALKPHOS, BILITOT,  in the last 168 hours      Component Value Date/Time   BILITOT 0.6 11/13/2009 1112     LABORATORY DATA:  On 09/22/2011 white cell count 6.1, hemoglobin 13.7,hematocrit 38.6, platelets 216.on 12/23/2011 his hemoglobin was 13.7 hematocrit 38.7 white count 5.3 and platelets 400. On 03/24/2012 his white count was 7.8 hemoglobin 13.9 hematocrit 38.8 and platelets 303.   Radiology Studies:  1. CT of the chest on 07/17/09 demonstrates  No sternal mass or fracture, but left anterior descending  coronary artery calcification the 2 .ultrasound of the abdomen on 10/01/2009 showed mild splenomegaly  with an index of 521 cc    ASSESSMENT AND PLAN:    IMPRESSION AND PLAN:  Mr. Minton is a 72 year old man with chronic myeloproliferative disorder/essential thrombocytosis, who is currently maintained on Hydrea 1000 mg p.o. daily.  Labs remain stable.  He remains asymptomatic.  He will schedule labs  every 3 months for now. He will follow with Dr. Arline Asp in 6 months time with labs. He will follow up on his coronary calcification issues with his primary care doctor. He knows to call us in the interim  if he has any questions or concerns.   Marlowe Kays E, PA-C 06/10/2012, 1:49 PM

## 2012-06-10 NOTE — Patient Instructions (Addendum)
Follow up blood work in 3 months and follow up with Dr. Arline Asp in 6 months with labs. Continue Hydrea as prescribed. Follow up with your primary doctor about calcifications in the arteries

## 2012-07-25 ENCOUNTER — Other Ambulatory Visit: Payer: Self-pay | Admitting: Oncology

## 2012-07-25 DIAGNOSIS — D693 Immune thrombocytopenic purpura: Secondary | ICD-10-CM

## 2012-09-12 ENCOUNTER — Ambulatory Visit: Payer: Medicare Other | Admitting: Oncology

## 2012-09-13 ENCOUNTER — Other Ambulatory Visit: Payer: Self-pay | Admitting: Medical Oncology

## 2012-09-13 DIAGNOSIS — D473 Essential (hemorrhagic) thrombocythemia: Secondary | ICD-10-CM

## 2012-09-14 ENCOUNTER — Other Ambulatory Visit (HOSPITAL_BASED_OUTPATIENT_CLINIC_OR_DEPARTMENT_OTHER): Payer: Medicare Other | Admitting: Lab

## 2012-09-14 DIAGNOSIS — D473 Essential (hemorrhagic) thrombocythemia: Secondary | ICD-10-CM

## 2012-09-14 LAB — COMPREHENSIVE METABOLIC PANEL (CC13)
CO2: 29 mEq/L (ref 22–29)
Glucose: 115 mg/dl (ref 70–140)
Sodium: 142 mEq/L (ref 136–145)
Total Bilirubin: 0.78 mg/dL (ref 0.20–1.20)
Total Protein: 7.3 g/dL (ref 6.4–8.3)

## 2012-09-14 LAB — CBC WITH DIFFERENTIAL/PLATELET
Eosinophils Absolute: 0 10*3/uL (ref 0.0–0.5)
HCT: 38.9 % (ref 38.4–49.9)
HGB: 13.6 g/dL (ref 13.0–17.1)
LYMPH%: 29.7 % (ref 14.0–49.0)
MONO#: 0.4 10*3/uL (ref 0.1–0.9)
NEUT#: 3 10*3/uL (ref 1.5–6.5)
NEUT%: 60.7 % (ref 39.0–75.0)
Platelets: 419 10*3/uL — ABNORMAL HIGH (ref 140–400)
WBC: 5 10*3/uL (ref 4.0–10.3)

## 2012-09-14 LAB — LACTATE DEHYDROGENASE (CC13): LDH: 168 U/L (ref 125–245)

## 2012-09-15 ENCOUNTER — Other Ambulatory Visit: Payer: Self-pay | Admitting: Medical Oncology

## 2012-09-15 ENCOUNTER — Telehealth: Payer: Self-pay | Admitting: Medical Oncology

## 2012-09-15 DIAGNOSIS — D473 Essential (hemorrhagic) thrombocythemia: Secondary | ICD-10-CM

## 2012-09-15 NOTE — Telephone Encounter (Signed)
I called pt to let him know that Dr.Salem would like for him to continue his hydrea 1000 mg daily. We are going to recheck a CBC in a month. Pt states that he did remember he went camping for a long weekend and he did not take his medication. I explained this might be the reason the platelets have gone up. He is aware the schedulers will call with appointments.

## 2012-10-17 ENCOUNTER — Other Ambulatory Visit (HOSPITAL_BASED_OUTPATIENT_CLINIC_OR_DEPARTMENT_OTHER): Payer: Medicare Other

## 2012-10-17 DIAGNOSIS — D473 Essential (hemorrhagic) thrombocythemia: Secondary | ICD-10-CM

## 2012-10-17 LAB — CBC WITH DIFFERENTIAL/PLATELET
BASO%: 0.7 % (ref 0.0–2.0)
HCT: 37 % — ABNORMAL LOW (ref 38.4–49.9)
MCHC: 35.9 g/dL (ref 32.0–36.0)
MONO#: 0.4 10*3/uL (ref 0.1–0.9)
NEUT%: 61.8 % (ref 39.0–75.0)
RBC: 3.38 10*6/uL — ABNORMAL LOW (ref 4.20–5.82)
RDW: 14.9 % — ABNORMAL HIGH (ref 11.0–14.6)
WBC: 6.2 10*3/uL (ref 4.0–10.3)
lymph#: 1.8 10*3/uL (ref 0.9–3.3)

## 2012-10-19 ENCOUNTER — Telehealth: Payer: Self-pay | Admitting: Medical Oncology

## 2012-10-19 NOTE — Telephone Encounter (Signed)
Pt for results of his CBC. I gave him the results. He asked if I could fax these to his primary Dr. Clarene Duke.

## 2012-12-08 ENCOUNTER — Other Ambulatory Visit: Payer: Self-pay | Admitting: Medical Oncology

## 2012-12-08 DIAGNOSIS — D473 Essential (hemorrhagic) thrombocythemia: Secondary | ICD-10-CM

## 2012-12-09 ENCOUNTER — Other Ambulatory Visit: Payer: Medicare Other | Admitting: Lab

## 2012-12-09 ENCOUNTER — Ambulatory Visit: Payer: Medicare Other

## 2015-02-14 DIAGNOSIS — Z79899 Other long term (current) drug therapy: Secondary | ICD-10-CM | POA: Insufficient documentation

## 2015-07-19 ENCOUNTER — Other Ambulatory Visit: Payer: Self-pay | Admitting: Family Medicine

## 2015-07-19 DIAGNOSIS — Z87891 Personal history of nicotine dependence: Secondary | ICD-10-CM

## 2016-12-08 ENCOUNTER — Encounter: Payer: Self-pay | Admitting: *Deleted

## 2016-12-08 ENCOUNTER — Encounter: Payer: Self-pay | Admitting: Neurology

## 2016-12-08 ENCOUNTER — Ambulatory Visit (INDEPENDENT_AMBULATORY_CARE_PROVIDER_SITE_OTHER): Payer: Medicare Other | Admitting: Neurology

## 2016-12-08 DIAGNOSIS — R413 Other amnesia: Secondary | ICD-10-CM | POA: Insufficient documentation

## 2016-12-08 NOTE — Progress Notes (Signed)
PATIENT: Jordan Potts DOB: [birth year]  Chief Complaint  Patient presents with  . Memory Loss    MMSE 23/30 - 10 animals.  He is here with his wife, Izora Gala, to have his worsening memory further evaluated.  His short-term memory is more of a concern.  His wife feels he is having difficulty formulating his thoughts, especially when on the telephone.  . Limb Jerking    Reports involuntary jerking of bilater upper and lower extremities.    Marland Kitchen PCP    Little, Lennette Bihari, MD     HISTORICAL  Jordan Potts is a 76 year old male, seen in refer by his primary care doctor Little, Lennette Bihari for evaluation of memory loss, limb jerking movement, initial evaluation was December 08 2016.   I reviewed and summarized the referring note, he had a history of hypertension, thrombocytosis, chronic myeloproliferative disorder followed by hematologist Dr. Jacqulyn Ducking at [facility], treated with hydroxyurea, history of kidney stone, had a history of colectomy in 1985 for diverticulitis,  He is a retired [employer] officer, he was noted to have memory loss over the past few years, he tends to repeat things, difficulty holding a conversation, especially over the phone, he has difficulty putting his thoughts together, fidgeting in his fingers, paced up and down,  He likes to read, play computer games, he was adopted, mother died at age 19, he used to have mood swings, now comes down now he still drives, able to keep his book in balance  Laboratory evaluations showed mild elevated creatinine 1.35, normal TSH, mild elevated LDL 108 Lab result from [facility] reviewed October 26 2016, MCV was elevated 104.5, hemoglobin was 13.6, normal CMP,  REVIEW OF SYSTEMS: Full 14 system review of systems performed and notable only for memory loss  ALLERGIES: No Known Allergies  HOME MEDICATIONS: Current Outpatient Prescriptions  Medication Sig Dispense Refill  . acetaminophen (TYLENOL) 500 MG tablet Take 1,000 mg by mouth as needed.    Marland Kitchen  amLODipine (NORVASC) 5 MG tablet Take 5 mg by mouth daily.    Marland Kitchen aspirin 81 MG tablet Take 81 mg by mouth daily.    . hydroxyurea (HYDREA) 500 MG capsule TAKE 2 CAPSULES BY MOUTH DAILY 60 capsule 4  . valsartan-hydrochlorothiazide (DIOVAN-HCT) 320-25 MG per tablet Take 1 tablet by mouth daily.     No current facility-administered medications for this visit.     PAST MEDICAL HISTORY: Past Medical History:  Diagnosis Date  . Chronic myeloproliferative disease   . Hypertension   . Kidney stone    history of kidney stone  . Memory loss   . Seborrhea   . Thrombocytosis ([place])     PAST SURGICAL HISTORY: Past Surgical History:  Procedure Laterality Date  . COLECTOMY  1995   S/P Colectomy for diverticulitis and hypertension.  Marland Kitchen KIDNEY STONE SURGERY    . TONSILLECTOMY      FAMILY HISTORY: Family History  Problem Relation Age of Onset  . Adopted: Yes  . Other Mother        MVA - age 44  . Other Father        unknown    SOCIAL HISTORY:  Social History   Social History  . Marital status: Married    Spouse name: N/A  . Number of children: 2  . Years of education: HS plus one year technical school   Occupational History  . Retired    Social History Main Topics  . Smoking status: Former Smoker  Packs/day: 1.00    Years: 5.00    Types: Cigarettes    Quit date: 11  . Smokeless tobacco: Never Used  . Alcohol use No  . Drug use: No  . Sexual activity: Not on file   Other Topics Concern  . Not on file   Social History Narrative   Lives at home with his wife.   Right-handed.   2-3 cups caffeine per day.     PHYSICAL EXAM   Vitals:   12/08/16 0848  BP: (!) 166/71  Pulse: (!) 59  Weight: 189 lb 12 oz (86.1 kg)  Height: 5' 11.5" (1.816 m)    Not recorded      Body mass index is 26.1 kg/m.  PHYSICAL EXAMNIATION:  Gen: NAD, conversant, well nourised, obese, well groomed                     Cardiovascular: Regular rate rhythm, no peripheral edema,  warm, nontender. Eyes: Conjunctivae clear without exudates or hemorrhage Neck: Supple, no carotid bruits. Pulmonary: Clear to auscultation bilaterally   NEUROLOGICAL EXAM:  MMSE - Mini Mental State Exam 12/08/2016  Orientation to time 5  Orientation to Place 4  Registration 3  Attention/ Calculation 1  Recall 1  Language- name 2 objects 2  Language- repeat 1  Language- follow 3 step command 3  Language- read & follow direction 1  Write a sentence 1  Copy design 1  Total score 23  animal naming 10   CRANIAL NERVES:Anxious looking elderly male CN II: Visual fields are full to confrontation. Fundoscopic exam is normal with sharp discs and no vascular changes. Pupils are round equal and briskly reactive to light. CN III, IV, VI: extraocular movement are normal. No ptosis. CN V: Facial sensation is intact to pinprick in all 3 divisions bilaterally. Corneal responses are intact.  CN VII: Face is symmetric with normal eye closure and smile. CN VIII: Hearing is normal to rubbing fingers CN IX, X: Palate elevates symmetrically. Phonation is normal. CN XI: Head turning and shoulder shrug are intact CN XII: Tongue is midline with normal movements and no atrophy.  MOTOR: There is no pronator drift of out-stretched arms. Muscle bulk and tone are normal. Muscle strength is normal.  REFLEXES: Reflexes are 2+ and symmetric at the biceps, triceps, knees, and ankles. Plantar responses are flexor.  SENSORY: Intact to light touch, pinprick, positional sensation and vibratory sensation are intact in fingers and toes.  COORDINATION: Rapid alternating movements and fine finger movements are intact. There is no dysmetria on finger-to-nose and heel-knee-shin.    GAIT/STANCE: Need to push up to get up from seated position, cautious, fairly steady.   DIAGNOSTIC DATA (LABS, IMAGING, TESTING) - I reviewed patient records, labs, notes, testing and imaging myself where available.   ASSESSMENT AND  PLAN  Jordan Potts is a 76 y.o. male   Mildly cognitive impairment  Mini-Mental status 23 out of 30  MRI of the brain to rule out structural lesion  Laboratory evaluations  Jordan Potts, M.D. Ph.D.  [facility] [address] [place], [place] [address] Ph: [phone] Fax: [phone]  CC: Hulan Fess, MD

## 2016-12-09 ENCOUNTER — Telehealth: Payer: Self-pay | Admitting: *Deleted

## 2016-12-09 LAB — VITAMIN B12: Vitamin B-12: 400 pg/mL (ref 232–1245)

## 2016-12-09 LAB — RPR: RPR Ser Ql: NONREACTIVE

## 2016-12-09 LAB — SEDIMENTATION RATE: SED RATE: 2 mm/h (ref 0–30)

## 2016-12-09 LAB — ANA W/REFLEX: ANA: NEGATIVE

## 2016-12-09 LAB — TSH: TSH: 2.08 u[IU]/mL (ref 0.450–4.500)

## 2016-12-09 LAB — C-REACTIVE PROTEIN: CRP: 1.6 mg/L (ref 0.0–4.9)

## 2016-12-09 NOTE — Telephone Encounter (Signed)
Spoke to pt's wife on HIPAA - she is aware of lab results.

## 2016-12-09 NOTE — Telephone Encounter (Signed)
-----   Message from Marcial Pacas, MD sent at 12/09/2016  4:28 PM EDT ----- Please call patient for normal laboratory result

## 2016-12-20 ENCOUNTER — Ambulatory Visit
Admission: RE | Admit: 2016-12-20 | Discharge: 2016-12-20 | Disposition: A | Discharge status: Home / Self Care | Payer: Medicare Other | Source: Ambulatory Visit | Attending: Neurology | Admitting: Neurology

## 2016-12-20 ENCOUNTER — Other Ambulatory Visit: Payer: Self-pay | Admitting: Neurology

## 2016-12-20 DIAGNOSIS — R413 Other amnesia: Secondary | ICD-10-CM

## 2016-12-20 DIAGNOSIS — Z77018 Contact with and (suspected) exposure to other hazardous metals: Secondary | ICD-10-CM

## 2016-12-21 ENCOUNTER — Telehealth: Payer: Self-pay | Admitting: Neurology

## 2016-12-21 NOTE — Telephone Encounter (Signed)
Spoke to patient and his wife - they are both aware of the results.

## 2016-12-21 NOTE — Telephone Encounter (Signed)
MRI of the brains showed generalized atrophy, there was no acute abnormality I will review MRIs with them in next follow-up visit  IMPRESSION:  This MRI of the brain without contrast shows the following: 1.    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   There are no significant ischemic changes. 2.      There are no acute findings.

## 2017-03-11 ENCOUNTER — Encounter: Payer: Self-pay | Admitting: Neurology

## 2017-03-11 ENCOUNTER — Ambulatory Visit: Payer: Medicare Other | Admitting: Neurology

## 2017-03-11 VITALS — BP 149/73 | HR 63 | Ht 71.5 in | Wt 193.5 lb

## 2017-03-11 DIAGNOSIS — G3184 Mild cognitive impairment, so stated: Secondary | ICD-10-CM | POA: Insufficient documentation

## 2017-03-11 MED ORDER — DONEPEZIL HCL 10 MG PO TABS: 10.0000 mg | ORAL_TABLET | Freq: Every day | ORAL | 11 refills | Status: DC

## 2017-03-11 MED ORDER — MEMANTINE HCL 10 MG PO TABS: 10.0000 mg | ORAL_TABLET | Freq: Two times a day (BID) | ORAL | 11 refills | Status: DC

## 2017-03-11 NOTE — Progress Notes (Signed)
PATIENT: Jordan Potts DOB: [birth year]  Chief Complaint  Patient presents with  . Memory Loss    He is here with his wife, Izora Gala, to review his MRI results.  His recent MMSE, completely on 12/08/16, showed a score of 23/30.     HISTORICAL  Jordan Potts is a 77 year old male, seen in refer by his primary care doctor Little, Lennette Bihari for evaluation of memory loss, limb jerking movement, initial evaluation was December 08 2016.   I reviewed and summarized the referring note, he had a history of hypertension, thrombocytosis, chronic myeloproliferative disorder followed by hematologist Dr. Jacqulyn Ducking at [facility], treated with hydroxyurea, history of kidney stone, had a history of colectomy in 1985 for diverticulitis,  He is a retired [employer] officer, he was noted to have memory loss over the past few years, he tends to repeat things, difficulty holding a conversation, especially over the phone, he has difficulty putting his thoughts together, fidgeting in his fingers, paced up and down,  He likes to read, play computer games, he was adopted, mother died at age 68, he used to have mood swings, now comes down now he still drives, able to keep his book in balance  Laboratory evaluations showed mild elevated creatinine 1.35, normal TSH, mild elevated LDL 108 Lab result from [facility] reviewed October 26 2016, MCV was elevated 104.5, hemoglobin was 13.6, normal CMP  UPDATE Mar 11 2017: I have personally reviewed MRI of the brain which showed moderate severe cortical atrophy, no acute abnormality  Extensive laboratory evaluations showed normal or negative ANA, ESR, C-reactive protein, RPR, B12, TSH.   REVIEW OF SYSTEMS: Full 14 system review of systems performed and notable only for memory loss  ALLERGIES: No Known Allergies  HOME MEDICATIONS: Current Outpatient Medications  Medication Sig Dispense Refill  . acetaminophen (TYLENOL) 500 MG tablet Take 1,000 mg by mouth as needed.    Marland Kitchen amLODipine  (NORVASC) 5 MG tablet Take 5 mg by mouth daily.    Marland Kitchen aspirin 81 MG tablet Take 81 mg by mouth daily.    . hydroxyurea (HYDREA) 500 MG capsule TAKE 2 CAPSULES BY MOUTH DAILY 60 capsule 4  . valsartan-hydrochlorothiazide (DIOVAN-HCT) 320-25 MG per tablet Take 1 tablet by mouth daily.     No current facility-administered medications for this visit.     PAST MEDICAL HISTORY: Past Medical History:  Diagnosis Date  . Chronic myeloproliferative disease   . Hypertension   . Kidney stone    history of kidney stone  . Memory loss   . Seborrhea   . Thrombocytosis ([place])     PAST SURGICAL HISTORY: Past Surgical History:  Procedure Laterality Date  . COLECTOMY  1995   S/P Colectomy for diverticulitis and hypertension.  Marland Kitchen KIDNEY STONE SURGERY    . TONSILLECTOMY      FAMILY HISTORY: Family History  Adopted: Yes  Problem Relation Age of Onset  . Other Mother        MVA - age 31  . Other Father        unknown    SOCIAL HISTORY:  Social History   Socioeconomic History  . Marital status: Married    Spouse name: Not on file  . Number of children: 2  . Years of education: HS plus one year technical school  . Highest education level: Not on file  Social Needs  . Financial resource strain: Not on file  . Food insecurity - worry: Not on file  . Food insecurity -  inability: Not on file  . Transportation needs - medical: Not on file  . Transportation needs - non-medical: Not on file  Occupational History  . Occupation: Retired  Tobacco Use  . Smoking status: Former Smoker    Packs/day: 1.00    Years: 5.00    Pack years: 5.00    Types: Cigarettes    Last attempt to quit: 1970    Years since quitting: 49.0  . Smokeless tobacco: Never Used  Substance and Sexual Activity  . Alcohol use: No  . Drug use: No  . Sexual activity: Not on file  Other Topics Concern  . Not on file  Social History Narrative   Lives at home with his wife.   Right-handed.   2-3 cups caffeine per day.      PHYSICAL EXAM   Vitals:   03/11/17 1019  BP: (!) 149/73  Pulse: 63  Weight: 193 lb 8 oz (87.8 kg)  Height: 5' 11.5" (1.816 m)    Not recorded      Body mass index is 26.61 kg/m.  PHYSICAL EXAMNIATION:  Gen: NAD, conversant, well nourised, obese, well groomed                     Cardiovascular: Regular rate rhythm, no peripheral edema, warm, nontender. Eyes: Conjunctivae clear without exudates or hemorrhage Neck: Supple, no carotid bruits. Pulmonary: Clear to auscultation bilaterally   NEUROLOGICAL EXAM:  MMSE - Mini Mental State Exam 12/08/2016  Orientation to time 5  Orientation to Place 4  Registration 3  Attention/ Calculation 1  Recall 1  Language- name 2 objects 2  Language- repeat 1  Language- follow 3 step command 3  Language- read & follow direction 1  Write a sentence 1  Copy design 1  Total score 23  animal naming 10   CRANIAL NERVES:Anxious looking elderly male CN II: Visual fields are full to confrontation. Fundoscopic exam is normal with sharp discs and no vascular changes. Pupils are round equal and briskly reactive to light. CN III, IV, VI: extraocular movement are normal. No ptosis. CN V: Facial sensation is intact to pinprick in all 3 divisions bilaterally. Corneal responses are intact.  CN VII: Face is symmetric with normal eye closure and smile. CN VIII: Hearing is normal to rubbing fingers CN IX, X: Palate elevates symmetrically. Phonation is normal. CN XI: Head turning and shoulder shrug are intact CN XII: Tongue is midline with normal movements and no atrophy.  MOTOR: There is no pronator drift of out-stretched arms. Muscle bulk and tone are normal. Muscle strength is normal.  REFLEXES: Reflexes are 2+ and symmetric at the biceps, triceps, knees, and ankles. Plantar responses are flexor.  SENSORY: Intact to light touch, pinprick, positional sensation and vibratory sensation are intact in fingers and toes.  COORDINATION: Rapid  alternating movements and fine finger movements are intact. There is no dysmetria on finger-to-nose and heel-knee-shin.    GAIT/STANCE: Need to push up to get up from seated position, cautious, fairly steady.   DIAGNOSTIC DATA (LABS, IMAGING, TESTING) - I reviewed patient records, labs, notes, testing and imaging myself where available.   ASSESSMENT AND PLAN  Manu Rubey Gabrys is a 77 y.o. male   Mild cognitive impairment  MRI of the brain showed significant atrophy  Laboratory evaluation showed no treatable etiology  Start Namenda 10 mg twice a day, Aricept 10 mg daily  Encouraged him continue moderate exercise,  Marcial Pacas, M.D. Ph.D.  [facility]  [address] [place], [place] [address] Ph: [phone] Fax: [phone]  CC: Hulan Fess, MD

## 2017-04-12 ENCOUNTER — Encounter: Payer: Self-pay | Admitting: *Deleted

## 2017-04-12 ENCOUNTER — Telehealth: Payer: Self-pay | Admitting: Neurology

## 2017-04-12 NOTE — Telephone Encounter (Signed)
He took memantine 10mg , BID for one month with only mild sleeping difficulty.  His problems became much worse when he added donepezil 10mg  daily.  Dr. Krista Blue has reviewed his chart.  He is going to try to stop donepezil to see if his symptoms improve.  If not, he will call us back.

## 2017-04-12 NOTE — Telephone Encounter (Signed)
Pt is is stating that donepezil (ARICEPT) 10 MG tablet or memantine (NAMENDA) 10 MG tablet has been keeping him awake at night, pt is requesting a call back to see if he can get anything else called in or can do anything to help.

## 2018-01-03 ENCOUNTER — Telehealth: Payer: Self-pay

## 2018-01-03 NOTE — Telephone Encounter (Signed)
Called pt to set up a new pt's appointment this Friday 01/07/18 with Dr. Harrell Gave in [place]. Left message for pt to call back.

## 2018-01-03 NOTE — Telephone Encounter (Signed)
Spoke with Patients daughter, Florene Glen, and appt made on January 07, 2018. Patient will need to complete a DPR.

## 2018-01-05 ENCOUNTER — Telehealth: Payer: Self-pay | Admitting: Neurology

## 2018-01-05 NOTE — Telephone Encounter (Signed)
Pt wife-Roderick,Nancy(on DPR) has called to inform that pt has recent had a fall and there has been a noticeable decline in pt's memory.  Wife wants to discuss why pt was not scheduled a f/u to see Dr Krista Blue.  Pt is scheduled now and is on wait list, wife wants pt evaluated soon as possible.  Please call

## 2018-01-05 NOTE — Telephone Encounter (Addendum)
I attempted the call again and was able to speak to the patient's wife, Jordan Potts (on [place]).  Reports her husband fell on 01/02/18 while they were in [place], [place].  She said the fall was due to him passing out.  He was taken to the ED at [facility]  He had to get stitches in his eyebrow and was newly diagnosed with a cardiac arrhythmia.  It was recommended for him to be admitted but he left AMA.  His wife stated it was due to them being out of town and him wishing to be treated locally.  He followed up with his PCP who referred him to cardiology.  He has a new patient appt with Dr. Harrell Gave on 01/07/18.  His memory has been worse and his PCP wanted him to follow up with our office.  Per vo by Dr. Krista Blue, he can see NP to get him into our office sooner.  He has been placed on Ward Givens, NP's schedule for 01/10/18.  The appt time is 2pm but they will arrive to our office at 1:30pm for check-in.

## 2018-01-05 NOTE — Telephone Encounter (Signed)
Returned call - no answer or voicemail.

## 2018-01-07 ENCOUNTER — Encounter: Payer: Self-pay | Admitting: Cardiology

## 2018-01-07 ENCOUNTER — Ambulatory Visit (INDEPENDENT_AMBULATORY_CARE_PROVIDER_SITE_OTHER): Payer: Medicare Other | Admitting: Cardiology

## 2018-01-07 VITALS — Ht 71.5 in | Wt 185.0 lb

## 2018-01-07 DIAGNOSIS — R55 Syncope and collapse: Secondary | ICD-10-CM

## 2018-01-07 DIAGNOSIS — R011 Cardiac murmur, unspecified: Secondary | ICD-10-CM

## 2018-01-07 DIAGNOSIS — I10 Essential (primary) hypertension: Secondary | ICD-10-CM

## 2018-01-07 NOTE — Progress Notes (Signed)
Cardiology Office Note:    Date:  01/07/2018   ID:  Gwenyth Bouillon Pickup, DOB [birth year], MRN [id number]  PCP:  Hulan Fess, MD  Cardiologist:  Buford Dresser, MD PhD  Referring MD: Hulan Fess, MD   CC: syncope  History of Present Illness:    Jordan Potts is a 77 y.o. male with a hx of hypertension, mild cognitive impairment who is seen as a new consult at the request of Little, Lennette Bihari, MD for the evaluation and management of syncope.  He was in [place] at [employer] when he passed out on 01/02/18. He had very little to drink that day (given issues with urinary frequency/urgency and travel plans) and had also been walking a great deal that day.He felt very hot and diaphoretic just before it happened. He was trying to get to a bench to sit down. Denies chest pain, palpitations, tunnel vision, or nausea. No bowel/bladder loss of control or confusion. He did injure himself in the fall, requiring stitches.  He was taken to [facility] for evaluation. He was noted to have a very elevated blood pressure, and there is also mention of ventricular trigeminy in the discharge instructions. I unfortunately cannot access the records. He was recommended to stay for admission, but given that he was far from home, he declined.  He has no personal past medical issues with his heart. Never had chest pain, shortness of breath. No PND, orthopnea, LE edema. No prior syncope. No palpitations. No prior cardiac evaluation.  FH: Half sister was born with a heart issue. Mother died at age 39 from a car accident, father's cause is unknown (they were divorce, no a lot of contact).   SH: lives with wife. Distant former smoker. Has had memory decline recently.  Past Medical History:  Diagnosis Date  . Chronic myeloproliferative disease   . Hypertension   . Kidney stone    history of kidney stone  . Memory loss   . Seborrhea   . Thrombocytosis  ([place])     Past Surgical History:  Procedure Laterality Date  . COLECTOMY  1995   S/P Colectomy for diverticulitis and hypertension.  Marland Kitchen KIDNEY STONE SURGERY    . TONSILLECTOMY      Current Medications: Current Outpatient Medications on File Prior to Visit  Medication Sig  . acetaminophen (TYLENOL) 500 MG tablet Take 1,000 mg by mouth as needed.  Marland Kitchen amLODipine (NORVASC) 5 MG tablet Take 5 mg by mouth daily.  Marland Kitchen aspirin 81 MG tablet Take 81 mg by mouth daily.  . hydroxyurea (HYDREA) 500 MG capsule TAKE 2 CAPSULES BY MOUTH DAILY  . losartan (COZAAR) 100 MG tablet Take 1 tablet by mouth daily.  . memantine (NAMENDA) 10 MG tablet Take 1 tablet (10 mg total) by mouth 2 (two) times daily.   No current facility-administered medications on file prior to visit.      Allergies:   Patient has no known allergies.   Social History   Socioeconomic History  . Marital status: Married    Spouse name: Not on file  . Number of children: 2  . Years of education: HS plus one year technical school  . Highest education level: Not on file  Occupational History  . Occupation: Retired  Scientific laboratory technician  . Financial resource strain: Not on file  . Food insecurity:    Worry: Not on file    Inability: Not on file  . Transportation needs:  Medical: Not on file    Non-medical: Not on file  Tobacco Use  . Smoking status: Former Smoker    Packs/day: 1.00    Years: 5.00    Pack years: 5.00    Types: Cigarettes    Last attempt to quit: 1970    Years since quitting: 49.8  . Smokeless tobacco: Never Used  Substance and Sexual Activity  . Alcohol use: No  . Drug use: No  . Sexual activity: Not on file  Lifestyle  . Physical activity:    Days per week: Not on file    Minutes per session: Not on file  . Stress: Not on file  Relationships  . Social connections:    Talks on phone: Not on file    Gets together: Not on file    Attends religious service: Not on file    Active member of club or  organization: Not on file    Attends meetings of clubs or organizations: Not on file    Relationship status: Not on file  Other Topics Concern  . Not on file  Social History Narrative   Lives at home with his wife.   Right-handed.   2-3 cups caffeine per day.     Family History: 6 sister was born with a heart issue. Mother died at age 64 from a car accident, father's cause is unknown (they were divorce, no a lot of contact).   ROS:   Please see the history of present illness.  Additional pertinent ROS:  Constitutional: Negative for chills, fever, night sweats, unintentional weight loss  HENT: Negative for ear pain and hearing loss.   Eyes: Negative for loss of vision and eye pain.  Respiratory: Negative for cough, sputum, shortness of breath, wheezing.   Cardiovascular: Negative for chest pain, palpitations , PND, orthopnea, lower extremity edema and claudication.  Gastrointestinal: Negative for abdominal pain, melena, and hematochezia.  Genitourinary: Negative for dysuria and hematuria.  Musculoskeletal: Negative for falls and myalgias.  Skin: Negative for itching and rash.  Neurological: Negative for focal weakness, focal sensory changes and loss of consciousness.  Endo/Heme/Allergies: Does not bruise/bleed easily.    EKGs/Labs/Other Studies Reviewed:    The following studies were reviewed today: Prior notes, discharge instructions from [facility]  EKG:  EKG is ordered today.  The ekg ordered today demonstrates sinus rhythm, 1st degree AV block, iLBBB.  Recent Labs: No results found for requested labs within last 8760 hours.  Recent Lipid Panel No results found for: CHOL, TRIG, HDL, CHOLHDL, VLDL, LDLCALC, LDLDIRECT  Physical Exam:    VS:  Ht 5' 11.5" (1.816 m)   Wt 185 lb (83.9 kg)   SpO2 97%   BMI 25.44 kg/m    Lying: 140/60, 80 bpm Sitting: 150/70, 79 bpm Standing: 145/68, 73 bpm  Wt Readings from Last 3 Encounters:  01/07/18 185 lb (83.9 kg)  03/11/17  193 lb 8 oz (87.8 kg)  12/08/16 189 lb 12 oz (86.1 kg)     GEN: Well nourished, well developed in no acute distress HEENT: Normal NECK: No JVD; No carotid bruits LYMPHATICS: No lymphadenopathy CARDIAC: regular rhythm, normal S1 and S2, no rubs, gallops. 1/6 early peaking SEM at RUSB. Radial and DP pulses 2+ bilaterally. RESPIRATORY:  Clear to auscultation without rales, wheezing or rhonchi  ABDOMEN: Soft, non-tender, non-distended MUSCULOSKELETAL:  No edema; No deformity  SKIN: Warm and dry NEUROLOGIC:  Alert and oriented x 3 PSYCHIATRIC:  Normal affect   ASSESSMENT:  1. Syncope, unspecified syncope type   2. Essential hypertension   3. Murmur, cardiac    PLAN:    Syncope: in the setting of poor hydration and prolonged walking/activity, may be reflex/vasovagal syncope. 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 This is concerning for underlying conduction disease. He also has a soft murmur, not suggestive of severe AS, but structural heart disease should be excluded as well.   -14 day Zio for conduction system/arrhythmia evaluation  -echo for structural heart disease  -not orthostatic today on vitals. Will allow for Bps in his current range (no upitration today) given recent syncope. Reports his BP are usually well controlled at home, and they were very elevated after the syncope (which has not happened before).   -he has a pending neurology appt as well  -no chest pain or suggestion of ischemia at the time  Plan for follow up: TBD based on results of testing. Patient and wife wished to see results first; if cardiac workup is unremarkable, they prefer to be seen PRN.   Medication Adjustments/Labs and Tests Ordered: Current medicines are reviewed at length with the patient today.  Concerns regarding medicines are outlined above.  Orders Placed This Encounter  Procedures  . LONG TERM MONITOR (3-14  DAYS)  . EKG 12-Lead  . ECHOCARDIOGRAM COMPLETE   No orders of the defined types were placed in this encounter.   Patient Instructions  Medication Instructions:  Your Physician recommend you continue on your current medication as directed.    If you need a refill on your cardiac medications before your next appointment, please call your pharmacy.   Lab work: None   Testing/Procedures: Your physician has requested that you have an echocardiogram. Echocardiography is a painless test that uses sound waves to create images of your heart. It provides your doctor with information about the size and shape of your heart and how well your heart's chambers and valves are working. This procedure takes approximately one hour. There are no restrictions for this procedure. [place] physician has recommended that you wear an 14 DAY ZIO-PATCH monitor. The Zio patch cardiac monitor continuously records heart rhythm data for up to 14 days, this is for patients being evaluated for multiple types heart rhythms. For the first 24 hours post application, please avoid getting the Zio monitor wet in the shower or by excessive sweating during exercise. After that, feel free to carry on with regular activities. Keep soaps and lotions away from the ZIO XT Patch.   [facility] [place]      Follow-Up: Your physician recommends that you schedule a follow-up appointment as needed with Dr. Harrell Gave.    Any Other Special Instructions Will Be Listed Below (If Applicable).       Signed, Buford Dresser, MD PhD 01/07/2018 12:49 PM    [place]

## 2018-01-07 NOTE — Patient Instructions (Addendum)
Medication Instructions:  Your Physician recommend you continue on your current medication as directed.    If you need a refill on your cardiac medications before your next appointment, please call your pharmacy.   Lab work: None   Testing/Procedures: Your physician has requested that you have an echocardiogram. Echocardiography is a painless test that uses sound waves to create images of your heart. It provides your doctor with information about the size and shape of your heart and how well your heart's chambers and valves are working. This procedure takes approximately one hour. There are no restrictions for this procedure. [place] physician has recommended that you wear an 14 DAY ZIO-PATCH monitor. The Zio patch cardiac monitor continuously records heart rhythm data for up to 14 days, this is for patients being evaluated for multiple types heart rhythms. For the first 24 hours post application, please avoid getting the Zio monitor wet in the shower or by excessive sweating during exercise. After that, feel free to carry on with regular activities. Keep soaps and lotions away from the ZIO XT Patch.   [facility] [place]      Follow-Up: Your physician recommends that you schedule a follow-up appointment as needed with Dr. Harrell Gave.    Any Other Special Instructions Will Be Listed Below (If Applicable).

## 2018-01-10 ENCOUNTER — Encounter: Payer: Self-pay | Admitting: Adult Health

## 2018-01-10 ENCOUNTER — Ambulatory Visit: Payer: Medicare Other | Admitting: Adult Health

## 2018-01-10 VITALS — BP 157/71 | HR 66 | Ht 71.5 in | Wt 191.0 lb

## 2018-01-10 DIAGNOSIS — R269 Unspecified abnormalities of gait and mobility: Secondary | ICD-10-CM

## 2018-01-10 DIAGNOSIS — R413 Other amnesia: Secondary | ICD-10-CM | POA: Diagnosis not present

## 2018-01-10 NOTE — Progress Notes (Signed)
PATIENT: Jordan Potts DOB: 06/13/1940  REASON FOR VISIT: follow up HISTORY FROM: patient  HISTORY OF PRESENT ILLNESS:  HISTORY Jordan Potts is a 77 year old male, seen in refer by his primary care doctor Little, Lennette Bihari for evaluation of memory loss, limb jerking movement, initial evaluation was December 08 2016.   I reviewed and summarized the referring note, he had a history of hypertension, thrombocytosis, chronic myeloproliferative disorder followed by hematologist Dr. Jacqulyn Ducking at [facility], treated with hydroxyurea, history of kidney stone, had a history of colectomy in 1985 for diverticulitis,  He is a retired [employer]chant [employer] officer, he was noted to have memory loss over the past few years, he tends to repeat things, difficulty holding a conversation, especially over the phone, he has difficulty putting his thoughts together, fidgeting in his fingers, paced up and down,  He likes to read, play computer games, he was adopted, mother died at age 29, he used to have mood swings, now comes down now he still drives, able to keep his book in balance  Laboratory evaluations showed mild elevated creatinine 1.35, normal TSH, mild elevated LDL 108 Lab result from [facility] reviewed October 26 2016, MCV was elevated 104.5, hemoglobin was 13.6, normal CMP  UPDATE Mar 11 2017: I have personally reviewed MRI of the brain which showed moderate severe cortical atrophy, no acute abnormality  Extensive laboratory evaluations showed normal or negative ANA, ESR, C-reactive protein, RPR, B12, TSH.   Today 01/10/18:  Jordan Potts is a 77 year old male with a history of memory disturbance.  He returns today for follow-up.  The patient went to [employer] with his wife and daughter.  He had a syncopal event while there.  He did go to the emergency room while in [place].  He reports that he had a CT of the head that was unremarkable.  He has since followed up with his cardiologist.  They are requesting  records from his hospital stay per the patient.  His wife states that overall he has slowly come back to his baseline since the fall.  He is able to complete most ADLs independently.  May require some prompting with brushing his teeth.  He reports that he does cook.  Denies burning food.  Denies any trouble sleeping.  Reports a fair appetite.  Denies any trouble managing his finances.  His wife now writes the checks.  Denies any changes in his mood or behavior.  No hallucinations.  He manages his own medications.  His wife manages the appointment.  His wife and daughter notes some trouble with his gait.  He does have a cane but tends not to use it appropriately.  He returns today for evaluation.   REVIEW OF SYSTEMS: Out of a complete 14 system review of symptoms, the patient complains only of the following symptoms, and all other reviewed systems are negative.  See HPI  ALLERGIES: No Known Allergies  HOME MEDICATIONS: Outpatient Medications Prior to Visit  Medication Sig Dispense Refill  . amLODipine (NORVASC) 5 MG tablet Take 5 mg by mouth daily.    Marland Kitchen donepezil (ARICEPT) 10 MG tablet Take 10 mg by mouth at bedtime.    . hydroxyurea (HYDREA) 500 MG capsule TAKE 2 CAPSULES BY MOUTH DAILY 60 capsule 4  . losartan (COZAAR) 100 MG tablet Take 1 tablet by mouth daily.    . memantine (NAMENDA) 10 MG tablet Take 1 tablet (10 mg total) by mouth 2 (two) times daily. 60 tablet 11  . acetaminophen (TYLENOL)  500 MG tablet Take 1,000 mg by mouth as needed.    Marland Kitchen aspirin 81 MG tablet Take 81 mg by mouth daily.     No facility-administered medications prior to visit.     PAST MEDICAL HISTORY: Past Medical History:  Diagnosis Date  . Chronic myeloproliferative disease   . Hypertension   . Kidney stone    history of kidney stone  . Memory loss   . Seborrhea   . Thrombocytosis ([place])     PAST SURGICAL HISTORY: Past Surgical History:  Procedure Laterality Date  . COLECTOMY  1995   S/P Colectomy  for diverticulitis and hypertension.  Marland Kitchen KIDNEY STONE SURGERY    . TONSILLECTOMY      FAMILY HISTORY: Family History  Adopted: Yes  Problem Relation Age of Onset  . Other Mother        MVA - age 13  . Other Father        unknown    SOCIAL HISTORY: Social History   Socioeconomic History  . Marital status: Married    Spouse name: Izora Gala  . Number of children: 2  . Years of education: HS plus one year technical school  . Highest education level: Not on file  Occupational History  . Occupation: Retired  Scientific laboratory technician  . Financial resource strain: Not on file  . Food insecurity:    Worry: Not on file    Inability: Not on file  . Transportation needs:    Medical: Not on file    Non-medical: Not on file  Tobacco Use  . Smoking status: Former Smoker    Packs/day: 1.00    Years: 5.00    Pack years: 5.00    Types: Cigarettes    Last attempt to quit: 1970    Years since quitting: 49.8  . Smokeless tobacco: Never Used  Substance and Sexual Activity  . Alcohol use: No  . Drug use: No  . Sexual activity: Not on file  Lifestyle  . Physical activity:    Days per week: Not on file    Minutes per session: Not on file  . Stress: Not on file  Relationships  . Social connections:    Talks on phone: Not on file    Gets together: Not on file    Attends religious service: Not on file    Active member of club or organization: Not on file    Attends meetings of clubs or organizations: Not on file    Relationship status: Not on file  . Intimate partner violence:    Fear of current or ex partner: Not on file    Emotionally abused: Not on file    Physically abused: Not on file    Forced sexual activity: Not on file  Other Topics Concern  . Not on file  Social History Narrative   Lives at home with his wife.   Right-handed.   2-3 cups caffeine per day.      PHYSICAL EXAM  Vitals:   01/10/18 1410  BP: (!) 157/71  Pulse: 66  Weight: 191 lb (86.6 kg)  Height: 5' 11.5"  (1.816 m)   Body mass index is 26.27 kg/m.  Generalized: Well developed, in no acute distress   Neurological examination  Mentation: Alert . Follows all commands speech and language fluent Cranial nerve II-XII: Pupils were equal round reactive to light. Extraocular movements were full, visual field were full on confrontational test. Facial sensation and strength were normal. Uvula tongue midline. Head  turning and shoulder shrug  were normal and symmetric. Motor: The motor testing reveals 5 over 5 strength of all 4 extremities. Good symmetric motor tone is noted throughout.  Patient is somewhat restless/fidgety  Sensory: Sensory testing is intact to soft touch on all 4 extremities. No evidence of extinction is noted.  Coordination: Cerebellar testing reveals good finger-nose-finger and heel-to-shin bilaterally.  Gait and station: Gait is slightly unsteady.  Tandem gait is unsteady.  Romberg is negative Reflexes: Deep tendon reflexes are symmetric and normal bilaterally.   DIAGNOSTIC DATA (LABS, IMAGING, TESTING) - I reviewed patient records, labs, notes, testing and imaging myself where available.  Lab Results  Component Value Date   WBC 6.2 10/17/2012   HGB 13.3 10/17/2012   HCT 37.0 (L) 10/17/2012   MCV 109.4 (H) 10/17/2012   PLT 313 10/17/2012      Component Value Date/Time   NA 142 09/14/2012 1023   K 3.7 09/14/2012 1023   CL 106 06/10/2012 0935   CO2 29 09/14/2012 1023   GLUCOSE 115 09/14/2012 1023   GLUCOSE 109 (H) 06/10/2012 0935   BUN 21.5 09/14/2012 1023   CREATININE 1.3 09/14/2012 1023   CALCIUM 9.6 09/14/2012 1023   PROT 7.3 09/14/2012 1023   ALBUMIN 3.8 09/14/2012 1023   AST 17 09/14/2012 1023   ALT 20 09/14/2012 1023   ALKPHOS 82 09/14/2012 1023   BILITOT 0.78 09/14/2012 1023    Lab Results  Component Value Date   VITAMINB12 400 12/08/2016   Lab Results  Component Value Date   TSH 2.080 12/08/2016      ASSESSMENT AND PLAN 78 y.o. year old male   has a past medical history of Chronic myeloproliferative disease, Hypertension, Kidney stone, Memory loss, Seborrhea, and Thrombocytosis ([place]). here with :  1.  Dementia  The patient's memory score has slightly decreased since the last visit which one year ago.  He will continue on Namenda and Aricept.  I will make referral to physical therapy for gait and balance training.  I have advised that if his symptoms worsen or he develops new symptoms he should let us know.  He will follow-up in 6 months or sooner if needed.   I spent 15 minutes with the patient. 50% of this time was spent reviewing his memory score and plan of care   Ward Givens, MSN, NP-C 01/10/2018, 3:37 PM [facility] [address], [place], [place] [address] [phone]

## 2018-01-10 NOTE — Patient Instructions (Signed)
Your Plan:  Continue to monitor memory- score is slightly decreased Referral to PT for walking and balance If your symptoms worsen or you develop new symptoms please let us know.   Thank you for coming to see [place] at [facility]. I hope we have been able to provide you high quality care today.  You may receive a patient satisfaction survey over the next few weeks. We would appreciate your feedback and comments so that we may continue to improve ourselves and the health of our patients.

## 2018-01-14 ENCOUNTER — Ambulatory Visit (HOSPITAL_BASED_OUTPATIENT_CLINIC_OR_DEPARTMENT_OTHER)
Admission: RE | Admit: 2018-01-14 | Discharge: 2018-01-14 | Disposition: A | Discharge status: Home / Self Care | Payer: Medicare Other | Source: Ambulatory Visit | Attending: Cardiology | Admitting: Cardiology

## 2018-01-14 DIAGNOSIS — R55 Syncope and collapse: Secondary | ICD-10-CM | POA: Diagnosis present

## 2018-01-14 NOTE — Progress Notes (Signed)
  Echocardiogram 2D Echocardiogram has been performed.  Jordan Potts Jordan Potts 01/14/2018, 2:17 PM

## 2018-01-18 ENCOUNTER — Ambulatory Visit: Payer: Medicare Other

## 2018-01-18 DIAGNOSIS — R55 Syncope and collapse: Secondary | ICD-10-CM

## 2018-01-19 NOTE — Progress Notes (Signed)
I have reviewed and agreed above plan. 

## 2018-01-25 ENCOUNTER — Other Ambulatory Visit: Payer: Self-pay

## 2018-01-25 ENCOUNTER — Ambulatory Visit: Payer: Medicare Other | Attending: Adult Health | Admitting: Physical Therapy

## 2018-01-25 ENCOUNTER — Encounter: Payer: Self-pay | Admitting: Physical Therapy

## 2018-01-25 DIAGNOSIS — R2681 Unsteadiness on feet: Secondary | ICD-10-CM | POA: Diagnosis present

## 2018-01-25 DIAGNOSIS — R2689 Other abnormalities of gait and mobility: Secondary | ICD-10-CM | POA: Diagnosis present

## 2018-01-25 DIAGNOSIS — R29898 Other symptoms and signs involving the musculoskeletal system: Secondary | ICD-10-CM | POA: Insufficient documentation

## 2018-01-25 NOTE — Therapy (Signed)
[facility] [address]  [place], [place], [address] Phone: [phone]   Fax:  [phone]  Physical Therapy Evaluation  Patient Details  Name: Jordan Potts MRN: [id number] Date of Birth: 01-01-1941 Referring Provider (PT): Ward Givens, NP Marcial Pacas, MD)   Encounter Date: 01/25/2018  PT End of Session - 01/25/18 1204    Visit Number  1    Number of Visits  7    Date for PT Re-Evaluation  03/08/18    Authorization Type  [employer] Medicare    PT Start Time  1102    PT Stop Time  1149    PT Time Calculation (min)  47 min    Equipment Utilized During Treatment  Gait belt    Activity Tolerance  Patient tolerated treatment well    Behavior During Therapy  WFL for tasks assessed/performed       Past Medical History:  Diagnosis Date  . Chronic myeloproliferative disease   . Hypertension   . Kidney stone    history of kidney stone  . Memory loss   . Seborrhea   . Thrombocytosis ([place])     Past Surgical History:  Procedure Laterality Date  . COLECTOMY  1995   S/P Colectomy for diverticulitis and hypertension.  Marland Kitchen KIDNEY STONE SURGERY    . TONSILLECTOMY      There were no vitals filed for this visit.   Subjective Assessment - 01/25/18 1105    Subjective  Patient present with wife. Reports he passed out when he was in [place] a couple weeks ago- MDs suggest possible dehydration d/t prolonged walking. Had cardiac echo after this event which was WNLs. Has not had any other falls. Wife reports he was been furniture walking and holding onto his wife for support for the past 1.5 years. Worse now since passing out. 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 Reports he has to hold onto a rail when walking up steps. Wife reports he has trouble stepping out of the tub because it is slick. Denies weakness or difficulty with walking in the dark. Reports L leg falls asleep after prolonged sitting and is hard to start moving after this  period. Reports R hip has been bothering him if he standing for a long time. Wife reports that patient tends to bounce knees up and down when standing for prolonged periods. Has not spoken to MDs about this and this pain has lasted about 1 year. Denies N/T or radiation. Patient reports meds prescribed by neurologist have been causing him to be off balance.     Patient is accompained by:  Family member   wife   Pertinent History  thrombocytosis, memory loss, kidney stone, HTN, chronic myeloproloferative disease, colectomy     Limitations  Standing;Walking;House hold activities    How long can you stand comfortably?  30 min    How long can you walk comfortably?  1 hour    Diagnostic tests  12/20/17: MRI of the brain which showed moderate severe cortical atrophy, no acute abnormality; 01/14/18 cardiac echo WNLs    Patient Stated Goals  per wife- be able to walk and take care of himself as long as possible    Currently in Pain?  Yes    Pain Score  0-No pain    Pain Location  Buttocks    Pain Orientation  Right    Pain Descriptors / Indicators  Sharp    Pain Type  Chronic  pain    Aggravating Factors   prolonged standing/walking    Pain Relieving Factors  rest         [facility] PT Assessment - 01/25/18 1116      Assessment   Medical Diagnosis  Gait abnormality, Memory loss    Referring Provider (PT)  Ward Givens, NP   Marcial Pacas, MD   Onset Date/Surgical Date  --   1.5 years   Next MD Visit  --   not scheduled   Prior Therapy  No      Precautions   Precautions  --   syncopal episode     Restrictions   Weight Bearing Restrictions  No      Balance Screen   Has the patient fallen in the past 6 months  Yes    How many times?  1   passed out   Has the patient had a decrease in activity level because of a fear of falling?   No    Is the patient reluctant to leave their home because of a fear of falling?   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     Sensation   Light Touch  Appears Intact      Coordination   Gross Motor Movements are Fluid and Coordinated  Yes      Posture/Postural Control   Posture/Postural Control  Postural limitations    Postural Limitations  Rounded Shoulders;Forward head      ROM / Strength   AROM / PROM / Strength  Strength;AROM      AROM   AROM Assessment Site  Ankle    Right/Left Ankle  Right;Left    Right Ankle Dorsiflexion  2    Left Ankle Dorsiflexion  -5      Strength   Strength Assessment Site  Hip;Knee;Ankle    Right/Left Hip  Right;Left    Right Hip Flexion  4+/5    Right Hip ABduction  4+/5    Right Hip ADduction  4+/5    Left Hip Flexion  4+/5    Left Hip ABduction  4+/5    Left Hip ADduction  4+/5    Right/Left Knee  Right;Left    Right Knee Flexion  4+/5    Right Knee Extension  5/5    Left Knee Flexion  4+/5    Left Knee Extension  4+/5    Right/Left Ankle  Right;Left    Right Ankle Dorsiflexion  4+/5    Right Ankle Plantar Flexion  4+/5    Left Ankle Dorsiflexion  4+/5    Left Ankle Plantar Flexion  4+/5      Flexibility   Soft Tissue Assessment Lindaann Slough  Length  yes      Ambulation/Gait   Assistive device  None    Gait Pattern  Step-through pattern;Decreased hip/knee flexion - right;Decreased dorsiflexion - right;Decreased hip/knee flexion - left;Decreased dorsiflexion - left   decreased gait speed     Balance   Balance Assessed  Yes       Static Standing Balance   Static Standing Balance -  Activities   Romberg - Eyes Opened;Romberg - Eyes Closed;Romberg - Eyes Opened, Foam;Romberg - Eyes Closed , Foam;Single Leg Stance - Right Leg;Single Leg Stance - Left Leg   moderate sway with EO/foam and EC/foam; R SLS 3 sec, L SLS 0     Standardized Balance Assessment   Standardized Balance Assessment  Timed Up and Go Test      Timed Up and Go Test   Normal TUG (seconds)  12.4   12.4 no AD, 14.0 SPC; unsteady with turns               Objective measurements completed on examination: See above findings.              PT Education - 01/25/18 1203    Education Details  prognosis, POC, HEP; advised to perform standing exercises at counter top for safety    Person(s) Educated  Patient;Spouse   wife   Methods  Explanation;Demonstration;Tactile cues;Verbal cues;Handout    Comprehension  Verbalized understanding;Returned demonstration       PT Short Term Goals - 01/25/18 1215      PT SHORT TERM GOAL #1   Title  Patient to be independent with initial HEP.    Time  3    Period  Weeks    Status  New    Target Date  02/15/18        PT Long Term Goals - 01/25/18 1216      PT LONG TERM GOAL #1   Title  Patient to be independent with advanced HEP.    Time  6    Period  Weeks    Status  New    Target Date  03/08/18      PT LONG TERM GOAL #2   Title  Patient to demonstrate 10 deg B ankle dorsiflexion AROM to decrease risk of falls.    Time  6    Period  Weeks    Status  New    Target Date  03/08/18      PT LONG TERM GOAL #3   Title  Patient to demonstrate 10 sec SLS on each LE without LOB.    Time  6    Period  Weeks    Status  New    Target Date  03/08/18      PT LONG TERM GOAL #4   Title  Patient to score >=19/24 on DGI without AD to decrease risk of falls.    Time  6    Period  Weeks    Status  New    Target Date  03/08/18      PT LONG TERM GOAL #5   Title  Patient to demonstrate mild  sway with M-CTSIB conditions EO/foam surface and EC/foam surface to improve ability to balance on compliant surfaces.     Time  6    Period  Weeks    Status  New    Target Date  03/08/18             Plan - 01/25/18 1204    Clinical Impression Statement  Patient is a 77y/o M presenting to OPPT with wife with c/o imbalance and gait instability. Had syncopal event a few weeks ago- cleared by Cardio. Has not has any falls other than this episode, however wife reports chronic imbalance has gotten worse since this event. Reports furniture walking or holding onto wife for support when walking, owns [employer] but does not use it, difficulty walking up steps, and stepping out of tub. Reports meds prescribed by Neurologist have been causing him to be off balance. Also reports intermittent L LE "falling asleep" after prolonged sitting, and R buttock pain after prolonged standing but has not notified MD of this pain. Patient today with good overall LE strength, decreased DF AROM, imbalance with SLS and on compliant surfaces, gait deviations. Instructed patient on using [facility] and encouraged patient to practice at home before going out in the community. Also advised patient to speak with Neurologist if meds may be possible cause of imbalance. Educated patient and wife on basic HEP. Patient reported understanding. Would benefit from skilled PT services 1x/week for 6 weeks to address aforementioned impairments.     Clinical Presentation  Stable    Clinical Decision Making  Low    Rehab Potential  Good    PT Frequency  1x / week    PT Duration  6 weeks    PT Treatment/Interventions  ADLs/Self Care Home Management;Cryotherapy;Electrical Stimulation;Moist Heat;Ultrasound;DME Instruction;Stair training;Gait training;Functional mobility training;Therapeutic activities;Therapeutic exercise;Manual techniques;Orthotic Fit/Training;Patient/family education;Neuromuscular re-education;Balance training;Passive range of motion;Dry  needling;Energy conservation;Splinting;Taping    PT Next Visit Plan  reassess HEP; assess DGI    Consulted and Agree with Plan of Care  Patient       Patient will benefit from skilled therapeutic intervention in order to improve the following deficits and impairments:  Abnormal gait, Pain, Decreased strength, Decreased knowledge of use of DME, Decreased activity tolerance, Decreased balance, Decreased mobility, Difficulty walking, Improper body mechanics, Decreased range of motion, Decreased safety awareness, Impaired flexibility, Postural dysfunction  Visit Diagnosis: Unsteadiness on feet  Other abnormalities of gait and mobility  Other symptoms and signs involving the musculoskeletal system     Problem List Patient Active Problem List   Diagnosis Date Noted  . Mild cognitive impairment 03/11/2017  . Memory loss 12/08/2016  . Encounter for long-term (current) use of high-risk medication 02/14/2015  . 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 Kidney stone 09/22/2011    Janene Harvey, PT, DPT 01/25/18 12:21 PM    [facility] [address]  [place], [place], [address] Phone: [phone]   Fax:  [phone]  Name: Jordan Potts MRN: [id number] Date of Birth: 12/27/1940

## 2018-02-08 ENCOUNTER — Encounter: Payer: Self-pay | Admitting: Physical Therapy

## 2018-02-08 ENCOUNTER — Ambulatory Visit: Payer: Medicare Other | Attending: Adult Health | Admitting: Physical Therapy

## 2018-02-08 DIAGNOSIS — R29898 Other symptoms and signs involving the musculoskeletal system: Secondary | ICD-10-CM | POA: Diagnosis present

## 2018-02-08 DIAGNOSIS — R2681 Unsteadiness on feet: Secondary | ICD-10-CM | POA: Diagnosis present

## 2018-02-08 DIAGNOSIS — R2689 Other abnormalities of gait and mobility: Secondary | ICD-10-CM | POA: Insufficient documentation

## 2018-02-08 NOTE — Therapy (Signed)
[facility] [address]  [place], [place], [address] Phone: [phone]   Fax:  [phone]  Physical Therapy Treatment  Patient Details  Name: Jordan Potts MRN: [id number] Date of Birth: Aug 04, 1940 Referring Provider (PT): Ward Givens, NP Marcial Pacas, MD)   Encounter Date: 02/08/2018  PT End of Session - 02/08/18 1113    Visit Number  2    Number of Visits  7    Date for PT Re-Evaluation  03/08/18    Authorization Type  UHC Medicare    PT Start Time  [phone]   patient late   PT Stop Time  1016    PT Time Calculation (min)  40 min    Equipment Utilized During Treatment  Gait belt    Activity Tolerance  Patient tolerated treatment well    Behavior During Therapy  [facility] for tasks assessed/performed       Past Medical History:  Diagnosis Date  . Chronic myeloproliferative disease   . Hypertension   . Kidney stone    history of kidney stone  . Memory loss   . Seborrhea   . Thrombocytosis ([place])     Past Surgical History:  Procedure Laterality Date  . COLECTOMY  1995   S/P Colectomy for diverticulitis and hypertension.  Marland Kitchen KIDNEY STONE SURGERY    . TONSILLECTOMY      There were no vitals filed for this visit.  Subjective Assessment - 02/08/18 0937    Subjective  Patient reports that he has been doing well. No falls since last session. Has been trying to practice using SPC inside the home, hasn't been out much otherwise.     Pertinent History  thrombocytosis, memory loss, kidney stone, HTN, chronic myeloproloferative disease, colectomy     Diagnostic tests  12/20/17: MRI of the brain which showed moderate severe cortical atrophy, no acute abnormality; 01/14/18 cardiac echo WNLs    Patient Stated Goals  per wife- be able to walk and take care of himself as long as possible    Currently in Pain?  No/denies         [facility] PT Assessment - 02/08/18 0001      Standardized Balance Assessment   Standardized Balance Assessment  Dynamic Gait Index      Dynamic Gait Index   Level Surface  Normal    Change in Gait Speed  Moderate Impairment   festinating steps with fast speed   Gait with Horizontal Head Turns  Mild Impairment    Gait with Vertical Head Turns  Mild Impairment    Gait and Pivot Turn  Mild Impairment   c/o instability   Step Over Obstacle  Normal    Step Around Obstacles  Normal    Steps  Normal    Total Score  19    DGI comment:  no AD                   [facility] Adult PT Treatment/Exercise - 02/08/18 0001      Exercises   Exercises  Knee/Hip      Knee/Hip Exercises: Stretches   Gastroc Stretch  Right;Left;2 reps;30 seconds;Limitations    Gastroc Stretch Limitations  prostretch at counter      Knee/Hip Exercises: Aerobic   Nustep  L3 x 87min      Knee/Hip Exercises: Standing   SLS  R & L practicing at counter top 6x each LE   cues for chest up and  upright stance   Gait Training  tandem walking with intermittent UE support on counter top 4x length of counter    Other Standing Knee Exercises  R& L weight shift at coutner top 10x5" for prep for SLS    Other Standing Knee Exercises  R&L SLS with oppsite foot rolling ball under foot at counter top x10 each LE with CGA/min A      Knee/Hip Exercises: Seated   Sit to Sand  1 set;without UE support;10 reps   cues to slow down            PT Education - 02/08/18 1112    Education Details  edu on performing weight shifts before SLS, practicing rolling ball under foot at counter top for SLS practice, and slowing down with STS; advised to use SPC out in the community as needed     Person(s) Educated  Patient    Methods  Demonstration;Explanation;Tactile cues;Verbal cues    Comprehension  Verbalized understanding;Returned demonstration       PT Short Term Goals - 02/08/18 1120      PT SHORT TERM GOAL #1   Title  Patient to be independent with initial HEP.    Time  3    Period  Weeks    Status   On-going        PT Long Term Goals - 02/08/18 1120      PT LONG TERM GOAL #1   Title  Patient to be independent with advanced HEP.    Time  6    Period  Weeks    Status  On-going      PT LONG TERM GOAL #2   Title  Patient to demonstrate 10 deg B ankle dorsiflexion AROM to decrease risk of falls.    Time  6    Period  Weeks    Status  On-going      PT LONG TERM GOAL #3   Title  Patient to demonstrate 10 sec SLS on each LE without LOB.    Time  6    Period  Weeks    Status  On-going      PT LONG TERM GOAL #4   Title  Patient to score >=19/24 on DGI without AD to decrease risk of falls.    Time  6    Period  Weeks    Status  On-going      PT LONG TERM GOAL #5   Title  Patient to demonstrate mild sway with M-CTSIB conditions EO/foam surface and EC/foam surface to improve ability to balance on compliant surfaces.     Time  6    Period  Weeks    Status  On-going            Plan - 02/08/18 1114    Clinical Impression Statement  Patient arrived to session using SPC. Reported no falls since last session and compliance with HEP, noting some difficulty with SLS activities. Administered DGI- patient scored 19/24 indicating increased fall risk. Patient has particular difficulty with increasing gait speed, gait with horizontal and vertical head turns, and pivot turn. Patient reporting instability with any turning activities, with tendency to reach out as if "furniture walking." Reviewed HEP with patient and provided education on performing weight shifts before SLS, practicing rolling ball under foot at counter top for SLS practice, and slowing down with STS. Advised patient to use SPC out in the community as needed. Patient reported understanding of all education given. Ended session  with no complaints.     PT Treatment/Interventions  ADLs/Self Care Home Management;Cryotherapy;Electrical Stimulation;Moist Heat;Ultrasound;DME Instruction;Stair training;Gait training;Functional  mobility training;Therapeutic activities;Therapeutic exercise;Manual techniques;Orthotic Fit/Training;Patient/family education;Neuromuscular re-education;Balance training;Passive range of motion;Dry needling;Energy conservation;Splinting;Taping    PT Next Visit Plan  turning, dynamic head turns, increasing gait speed    Consulted and Agree with Plan of Care  Patient       Patient will benefit from skilled therapeutic intervention in order to improve the following deficits and impairments:  Abnormal gait, Pain, Decreased strength, Decreased knowledge of use of DME, Decreased activity tolerance, Decreased balance, Decreased mobility, Difficulty walking, Improper body mechanics, Decreased range of motion, Decreased safety awareness, Impaired flexibility, Postural dysfunction  Visit Diagnosis: Unsteadiness on feet  Other abnormalities of gait and mobility  Other symptoms and signs involving the musculoskeletal system     Problem List Patient Active Problem List   Diagnosis Date Noted  . Mild cognitive impairment 03/11/2017  . Memory loss 12/08/2016  . Encounter for long-term (current) use of high-risk medication 02/14/2015  . 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 Kidney stone 09/22/2011    Janene Harvey, PT, DPT 02/08/18 11:22 AM   [facility] [address]  [place], [place], [address] Phone: [phone]   Fax:  [phone]  Name: KAYODE PETION MRN: [id number] Date of Birth: 1940-04-25

## 2018-02-10 NOTE — Progress Notes (Signed)
Patient is ok to drive, as no significant abnormalities found related to his heart. Thanks!

## 2018-02-15 ENCOUNTER — Encounter: Payer: Self-pay | Admitting: Physical Therapy

## 2018-02-15 ENCOUNTER — Ambulatory Visit: Payer: Medicare Other | Admitting: Physical Therapy

## 2018-02-15 DIAGNOSIS — R29898 Other symptoms and signs involving the musculoskeletal system: Secondary | ICD-10-CM

## 2018-02-15 DIAGNOSIS — R2689 Other abnormalities of gait and mobility: Secondary | ICD-10-CM

## 2018-02-15 DIAGNOSIS — R2681 Unsteadiness on feet: Secondary | ICD-10-CM | POA: Diagnosis not present

## 2018-02-15 NOTE — Therapy (Signed)
[facility] [address]  [place], [place], [address] Phone: [phone]   Fax:  [phone]  Physical Therapy Treatment  Patient Details  Name: Jordan Potts MRN: [id number] Date of Birth: 1940-08-26 Referring Provider (PT): Ward Givens, NP Marcial Pacas, MD)   Encounter Date: 02/15/2018  PT End of Session - 02/15/18 1019    Visit Number  3    Number of Visits  7    Date for PT Re-Evaluation  03/08/18    Authorization Type  UHC Medicare    PT Start Time  0931    PT Stop Time  1015    PT Time Calculation (min)  44 min    Equipment Utilized During Treatment  Gait belt    Activity Tolerance  Patient tolerated treatment well    Behavior During Therapy  [facility] for tasks assessed/performed       Past Medical History:  Diagnosis Date  . Chronic myeloproliferative disease   . Hypertension   . Kidney stone    history of kidney stone  . Memory loss   . Seborrhea   . Thrombocytosis ([place])     Past Surgical History:  Procedure Laterality Date  . COLECTOMY  1995   S/P Colectomy for diverticulitis and hypertension.  Marland Kitchen KIDNEY STONE SURGERY    . TONSILLECTOMY      There were no vitals filed for this visit.  Subjective Assessment - 02/15/18 0933    Subjective  Reports that he has been well. Has been doing his exercises this week.     Pertinent History  thrombocytosis, memory loss, kidney stone, HTN, chronic myeloproloferative disease, colectomy     Diagnostic tests  12/20/17: MRI of the brain which showed moderate severe cortical atrophy, no acute abnormality; 01/14/18 cardiac echo WNLs    Patient Stated Goals  per wife- be able to walk and take care of himself as long as possible    Currently in Pain?  No/denies                       [facility] Adult PT Treatment/Exercise - 02/15/18 0001      Ambulation/Gait   Ambulation Distance (Feet)  400 Feet    Assistive device  Straight cane    Gait  Pattern  Step-through pattern;Decreased hip/knee flexion - right;Decreased dorsiflexion - right;Decreased hip/knee flexion - left;Decreased dorsiflexion - left    Gait Comments  gait training with SPC with cues for sequencing; practicing walking and performing 90 degree turn around cones with SPC- with and without cognitive challenge counting by 2s, 3s       Exercises   Exercises  Knee/Hip      Knee/Hip Exercises: Aerobic   Nustep  L3 x 64min      Knee/Hip Exercises: Standing   Forward Step Up  Right;Left;1 set;10 reps;Hand Hold: 0;Limitations    Forward Step Up Limitations  anterior step up on foam with CGA/min A fior correction of balance; cues to improve confidence and decrease hesitation    SLS with Vectors  R & L SLS using ring toss x6 min    Other Standing Knee Exercises  lateral steps over spike ball x10 and cone x10   cues for larger step for better foot clearance     Knee/Hip Exercises: Seated   Sit to Sand  1 set;without UE support   STS on foam with cues for upright trunk and slow eccentric l  PT Education - 02/15/18 1019    Education Details  update to [employer]) Educated  Patient    Methods  Explanation;Demonstration;Tactile cues;Verbal cues;Handout    Comprehension  Verbalized understanding;Returned demonstration       PT Short Term Goals - 02/15/18 1025      PT SHORT TERM GOAL #1   Title  Patient to be independent with initial HEP.    Time  3    Period  Weeks    Status  Achieved        PT Long Term Goals - 02/08/18 1120      PT LONG TERM GOAL #1   Title  Patient to be independent with advanced HEP.    Time  6    Period  Weeks    Status  On-going      PT LONG TERM GOAL #2   Title  Patient to demonstrate 10 deg B ankle dorsiflexion AROM to decrease risk of falls.    Time  6    Period  Weeks    Status  On-going      PT LONG TERM GOAL #3   Title  Patient to demonstrate 10 sec SLS on each LE without LOB.    Time  6    Period   Weeks    Status  On-going      PT LONG TERM GOAL #4   Title  Patient to score >=19/24 on DGI without AD to decrease risk of falls.    Time  6    Period  Weeks    Status  On-going      PT LONG TERM GOAL #5   Title  Patient to demonstrate mild sway with M-CTSIB conditions EO/foam surface and EC/foam surface to improve ability to balance on compliant surfaces.     Time  6    Period  Weeks    Status  On-going            Plan - 02/15/18 1019    Clinical Impression Statement  Patient arrived to session with report that he has been compliant with HEP and SPC use. Worked on Personnel officer with [facility] with patient requiring a couple steps before coordinating proper walking pattern. Worked on ambulation with 90 degree turns, with patient reporting more comfort with this activity now that he is more consistently using his SPC. Added cognitive challenge to this activity with patient having significantly more difficulty with cane sequencing and balance. Introduced ring toss SLS activity with patient demonstrating excellent focus and control, requiring intermittent CGA/min A for correction of imbalance. Cues requiring for STS activity standing on foam for increased eccentric lowering with good carryover by the last couple reps. Introduced lateral stepping over obstacles and added this to HEP to be performed at counter top. Patient reported understanding.     PT Treatment/Interventions  ADLs/Self Care Home Management;Cryotherapy;Electrical Stimulation;Moist Heat;Ultrasound;DME Instruction;Stair training;Gait training;Functional mobility training;Therapeutic activities;Therapeutic exercise;Manual techniques;Orthotic Fit/Training;Patient/family education;Neuromuscular re-education;Balance training;Passive range of motion;Dry needling;Energy conservation;Splinting;Taping    PT Next Visit Plan  dynamic head turns, increasing gait speed, gait training outside on uneven surfaces    Consulted and Agree with Plan of  Care  Patient       Patient will benefit from skilled therapeutic intervention in order to improve the following deficits and impairments:  Abnormal gait, Pain, Decreased strength, Decreased knowledge of use of DME, Decreased activity tolerance, Decreased balance, Decreased mobility, Difficulty walking, Improper body mechanics, Decreased range of motion, Decreased  safety awareness, Impaired flexibility, Postural dysfunction  Visit Diagnosis: Unsteadiness on feet  Other abnormalities of gait and mobility  Other symptoms and signs involving the musculoskeletal system     Problem List Patient Active Problem List   Diagnosis Date Noted  . Mild cognitive impairment 03/11/2017  . Memory loss 12/08/2016  . Encounter for long-term (current) use of high-risk medication 02/14/2015  . 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 Kidney stone 09/22/2011    Janene Harvey, PT, DPT 02/15/18 10:27 AM   [facility] [address]  [place], [place], [address] Phone: [phone]   Fax:  [phone]  Name: Jordan Potts MRN: [id number] Date of Birth: 08/07/1940

## 2018-02-22 ENCOUNTER — Encounter: Payer: Medicare Other | Admitting: Physical Therapy

## 2018-02-28 ENCOUNTER — Encounter: Payer: Medicare Other | Admitting: Physical Therapy

## 2018-03-07 ENCOUNTER — Encounter: Payer: Medicare Other | Admitting: Physical Therapy

## 2018-03-14 ENCOUNTER — Other Ambulatory Visit: Payer: Self-pay | Admitting: Neurology

## 2018-03-15 ENCOUNTER — Ambulatory Visit: Payer: Medicare Other | Attending: Adult Health | Admitting: Physical Therapy

## 2018-03-15 ENCOUNTER — Encounter: Payer: Self-pay | Admitting: Physical Therapy

## 2018-03-15 DIAGNOSIS — R2689 Other abnormalities of gait and mobility: Secondary | ICD-10-CM | POA: Diagnosis present

## 2018-03-15 DIAGNOSIS — R29898 Other symptoms and signs involving the musculoskeletal system: Secondary | ICD-10-CM

## 2018-03-15 DIAGNOSIS — R2681 Unsteadiness on feet: Secondary | ICD-10-CM | POA: Insufficient documentation

## 2018-03-15 NOTE — Addendum Note (Signed)
Addended by: Janene Harvey D on: 03/15/2018 01:40 PM   Modules accepted: Orders

## 2018-03-15 NOTE — Therapy (Addendum)
[facility] [address]  [place], [place], [address] Phone: [phone]   Fax:  [phone]  Physical Therapy Treatment  Patient Details  Name: Jordan Potts MRN: [id number] Date of Birth: 24-Mar-1940 Referring Provider (PT): Ward Givens, NP   Encounter Date: 03/15/2018  PT End of Session - 03/15/18 1152    Visit Number  4    Number of Visits  8    Date for PT Re-Evaluation  04/19/18   5 weeks d/t patient's schedule   Authorization Type  UHC Medicare    PT Start Time  1025   patient late   PT Stop Time  1102    PT Time Calculation (min)  41 min    Equipment Utilized During Treatment  Gait belt    Activity Tolerance  Patient tolerated treatment well    Behavior During Therapy  [facility] for tasks assessed/performed       Past Medical History:  Diagnosis Date  . Chronic myeloproliferative disease   . Hypertension   . Kidney stone    history of kidney stone  . Memory loss   . Seborrhea   . Thrombocytosis ([place])     Past Surgical History:  Procedure Laterality Date  . COLECTOMY  1995   S/P Colectomy for diverticulitis and hypertension.  Marland Kitchen KIDNEY STONE SURGERY    . TONSILLECTOMY      There were no vitals filed for this visit.  Subjective Assessment - 03/15/18 1021    Subjective  Reports that he had a good holiday and had a lot of family over at his house. Reports that he has been working on HEP over this break. Denies falls. Reports that he does not use his cane inside and notices that he is not having to furniture walk as much as before. Reports that single leg balance is not going well.     Patient is accompained by:  Family member   wife   Pertinent History  thrombocytosis, memory loss, kidney stone, HTN, chronic myeloproloferative disease, colectomy     Diagnostic tests  12/20/17: MRI of the brain which showed moderate severe cortical atrophy, no acute abnormality; 01/14/18 cardiac echo WNLs    Patient Stated Goals  per wife- be able to walk and take care of himself as long as possible    Currently in Pain?  No/denies         [facility] PT Assessment - 03/15/18 0001      Assessment   Medical Diagnosis  Gait abnormality, Memory loss    Referring Provider (PT)  Ward Givens, NP    Onset Date/Surgical Date  --   1.5 years     AROM   AROM Assessment Site  Ankle    Right/Left Ankle  Right;Left    Right Ankle Dorsiflexion  4    Left Ankle Dorsiflexion  5      Strength   Right Hip Flexion  4+/5    Right Hip ABduction  4+/5    Right Hip ADduction  4+/5    Left Hip Flexion  4+/5    Left Hip ABduction  4+/5    Left Hip ADduction  4+/5    Right Knee Flexion  5/5    Right Knee Extension  4+/5    Left Knee Flexion  5/5    Left Knee Extension  4+/5    Right Ankle Dorsiflexion  4+/5    Right Ankle Plantar Flexion  4+/5  Left Ankle Dorsiflexion  4+/5    Left Ankle Plantar Flexion  4+/5      Dynamic Gait Index   Level Surface  Normal    Change in Gait Speed  Mild Impairment    Gait with Horizontal Head Turns  Normal    Gait with Vertical Head Turns  Mild Impairment    Gait and Pivot Turn  Normal    Step Over Obstacle  Normal    Step Around Obstacles  Normal    Steps  Severe Impairment   2x catching toes on step when ascending, requiring min A   Total Score  19    DGI comment:  no AD      High Level Balance   High Level Balance Activites  Other (comment)   L SLS 5 sec, R SLS 8 sec   High Level Balance Comments  M-CTSIB EC/foam: mild-mod sway                   [facility] Adult PT Treatment/Exercise - 03/15/18 0001      Knee/Hip Exercises: Aerobic   Nustep  L4 x 60mn      Knee/Hip Exercises: Standing   Forward Step Up  Right;Left;1 set;10 reps;Hand Hold: 0;Step Height: 8"    Forward Step Up Limitations  anterior step up + step back; increased unsteadiness stepping with R LE    SLS  R & L practicing without UE support x5 min   cues for upright posture      Knee/Hip Exercises: Seated   Sit to Sand  1 set;without UE support;10 reps   on foam; cues for upright stance            PT Education - 03/15/18 1152    Education Details  update to HEP    Person(s) Educated  Patient    Methods  Explanation;Demonstration;Tactile cues;Verbal cues;Handout    Comprehension  Verbalized understanding;Returned demonstration       PT Short Term Goals - 03/15/18 1153      PT SHORT TERM GOAL #1   Title  Patient to be independent with initial HEP.    Time  3    Period  Weeks    Status  Achieved        PT Long Term Goals - 03/15/18 1153      PT LONG TERM GOAL #1   Title  Patient to be independent with advanced HEP.    Time  5    Period  Weeks    Status  Partially Met   met for current   Target Date  04/19/18      PT LONG TERM GOAL #2   Title  Patient to demonstrate 10 deg B ankle dorsiflexion AROM to decrease risk of falls.    Time  5    Period  Weeks    Status  Partially Met   AROM R 4 deg, L 5 deg   Target Date  04/19/18      PT LONG TERM GOAL #3   Title  Patient to demonstrate 10 sec SLS on each LE without LOB.    Time  5    Period  Weeks    Status  Partially Met   L SLS 5 sec, R SLS 8 sec   Target Date  04/19/18      PT LONG TERM GOAL #4   Title  Patient to score >=19/24 on DGI without AD to decrease risk of falls.  Time  5    Period  Weeks    Status  On-going   scored 19/24 today without AD   Target Date  04/19/18      PT LONG TERM GOAL #5   Title  Patient to demonstrate mild sway with M-CTSIB conditions EO/foam surface and EC/foam surface to improve ability to balance on compliant surfaces.     Time  5    Period  Weeks    Status  Partially Met   demonstrated mild-mod sway today   Target Date  04/19/18            Plan - 03/15/18 1200    Clinical Impression Statement  Patient presents to session after month-long hiatus with PT. Reports he has been performing HEP during this time. Notes he is still having  difficulty with SLS, however, mentions that he is not using SPC inside and not furniture-walking quite as much as before. Patient showing good progress in B ankle DF AROM, with R measuring 4 deg, L at 5 deg. Now able to perform SLS on L for 5 sec, on R for 8 sec with ability to self-correct imbalance. Tolerated narrow stance on foam with EC and demonstrated mild- moderate sway, improved from initial eval. Patient scored 19/24 on DGI without AD today, with most difficulty on stairs. Worked on dynamic balance activities on compliant surface with no LOB. Provided cues for slow eccentric lowering with anterior step up- step downs. Updated HEP with STS on compliant surface and toe taps with instruction to use UE support for safety. Patient showing good progress thus far, would benefit from skilled PT services 1x/week for 4 weeks to address aforementioned impairments. Patient in agreement.     PT Treatment/Interventions  ADLs/Self Care Home Management;Cryotherapy;Electrical Stimulation;Moist Heat;Ultrasound;DME Instruction;Stair training;Gait training;Functional mobility training;Therapeutic activities;Therapeutic exercise;Manual techniques;Orthotic Fit/Training;Patient/family education;Neuromuscular re-education;Balance training;Passive range of motion;Dry needling;Energy conservation;Splinting;Taping    PT Next Visit Plan  anterior and lateral step ups    Consulted and Agree with Plan of Care  Patient       Patient will benefit from skilled therapeutic intervention in order to improve the following deficits and impairments:  Abnormal gait, Pain, Decreased strength, Decreased knowledge of use of DME, Decreased activity tolerance, Decreased balance, Decreased mobility, Difficulty walking, Improper body mechanics, Decreased range of motion, Decreased safety awareness, Impaired flexibility, Postural dysfunction  Visit Diagnosis: Unsteadiness on feet - Plan: PT plan of care cert/re-cert  Other abnormalities of  gait and mobility - Plan: PT plan of care cert/re-cert  Other symptoms and signs involving the musculoskeletal system - Plan: PT plan of care cert/re-cert     Problem List Patient Active Problem List   Diagnosis Date Noted  . Mild cognitive impairment 03/11/2017  . Memory loss 12/08/2016  . Encounter for long-term (current) use of high-risk medication 02/14/2015  . 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 Kidney stone 09/22/2011    Janene Harvey, PT, DPT 03/15/18 1:36 PM    [facility] [address]  [place], [place], [address] Phone: [phone]   Fax:  [phone]  Name: Jordan Potts MRN: [id number] Date of Birth: 08-Apr-1940

## 2018-03-29 ENCOUNTER — Ambulatory Visit: Payer: Medicare Other | Admitting: Physical Therapy

## 2018-04-05 ENCOUNTER — Encounter: Payer: Self-pay | Admitting: Physical Therapy

## 2018-04-05 ENCOUNTER — Ambulatory Visit: Payer: Medicare Other | Admitting: Physical Therapy

## 2018-04-05 DIAGNOSIS — R29898 Other symptoms and signs involving the musculoskeletal system: Secondary | ICD-10-CM

## 2018-04-05 DIAGNOSIS — R2681 Unsteadiness on feet: Secondary | ICD-10-CM | POA: Diagnosis not present

## 2018-04-05 DIAGNOSIS — R2689 Other abnormalities of gait and mobility: Secondary | ICD-10-CM

## 2018-04-05 NOTE — Therapy (Addendum)
[facility] [place] [address]  [address] [place], [place], [address] Phone: [phone]   Fax:  [phone]  Physical Therapy Treatment  Patient Details  Name: Jordan Potts MRN: [id number] Date of Birth: 11/02/1940 Referring Provider (PT): Megan Millikan, NP   Progress Note Reporting Period 01/26/19 to 04/05/18  See note below for Objective Data and Assessment of Progress/Goals.    Encounter Date: 04/05/2018  PT End of Session - 04/05/18 1059    Visit Number  5    Number of Visits  8    Date for PT Re-Evaluation  04/19/18   5 weeks d/t patient's schedule   Authorization Type  [employer] Medicare    PT Start Time  1017    PT Stop Time  1058    PT Time Calculation (min)  41 min    Equipment Utilized During Treatment  Gait belt    Activity Tolerance  Patient tolerated treatment well    Behavior During Therapy  WFL for tasks assessed/performed       Past Medical History:  Diagnosis Date  . Chronic myeloproliferative disease   . Hypertension   . Kidney stone    history of kidney stone  . Memory loss   . Seborrhea   . Thrombocytosis (HCC)     Past Surgical History:  Procedure Laterality Date  . COLECTOMY  1995   S/P Colectomy for diverticulitis and hypertension.  . KIDNEY STONE SURGERY    . TONSILLECTOMY      There were no vitals filed for this visit.  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     Patient is accompained by:  Family member   wife   Pertinent History  thrombocytosis, memory loss, kidney stone, HTN, chronic myeloproloferative disease, colectomy     Diagnostic tests  12/20/17: MRI of the brain which showed moderate severe cortical atrophy, no acute abnormality; 01/14/18 cardiac echo WNLs    Patient Stated Goals  per wife- be able to walk and take care of himself as long as possible     Currently in Pain?  Yes    Pain Score  5     Pain Location  Ear    Pain Orientation  Left    Pain Descriptors / Indicators  --   pinching   Pain Type  Acute pain                       [facility] Adult PT Treatment/Exercise - 04/05/18 0001      Ambulation/Gait   Ambulation Distance (Feet)  500 Feet    Assistive device  Straight cane    Gait Pattern  Step-through pattern;Decreased hip/knee flexion - right;Decreased dorsiflexion - right;Decreased hip/knee flexion - left;Decreased dorsiflexion - left    Stairs  Yes    Stairs Assistance  4: Min guard    Stair Management Technique  One rail Right;With cane    Number of Stairs  13    Height of Stairs  8    Gait Comments  gait training outside up/down curbs, on grass, and uneven tiles with SPC and CGA; stair climbing with cues for SPC sequencing      Neuro Re-ed    Neuro Re-ed Details   lateral stepping on foam beam x3 min; standing balance on foam bean   with ant/pos and M/L perturbations x5 min   increased LOB posteriorly     Knee/Hip Exercises: Stretches   Gastroc Stretch  Right;Left;30 seconds;Limitations;1 rep    Gastroc Stretch Limitations  prostretch at counter      Knee/Hip Exercises: Aerobic   Nustep  L4 x 5min   pt requesting to stop d/t "legs feeling tired"     Knee/Hip Exercises: Standing   Other Standing Knee Exercises  R & L SLS rolling ball under foot x10 CW/CCW with hands hovering over counter top   more difficulty on L, requiring frequent cues for trunk lean   Other Standing Knee Exercises  forward/back stepping with vertical head turns to target x10 each foot with CGA   more difficulty on R              PT Short Term Goals - 03/15/18 1153      PT SHORT TERM GOAL #1   Title  Patient to be independent with initial HEP.    Time  3    Period  Weeks    Status  Achieved        PT Long Term Goals - 03/15/18 1153      PT LONG TERM GOAL #1   Title  Patient to be independent with advanced HEP.     Time  5    Period  Weeks    Status  Partially Met   met for current   Target Date  04/19/18      PT LONG TERM GOAL #2   Title  Patient to demonstrate 10 deg B ankle dorsiflexion AROM to decrease risk of falls.    Time  5    Period  Weeks    Status  Partially Met   AROM R 4 deg, L 5 deg   Target Date  04/19/18      PT LONG TERM GOAL #3   Title  Patient to demonstrate 10 sec SLS on each LE without LOB.    Time  5    Period  Weeks    Status  Partially Met   L SLS 5 sec, R SLS 8 sec   Target Date  04/19/18      PT LONG TERM GOAL #4   Title  Patient to score >=19/24 on DGI without AD to decrease risk of falls.    Time  5    Period  Weeks    Status  On-going   scored 19/24 today without AD   Target Date  04/19/18      PT LONG TERM GOAL #5   Title  Patient to demonstrate mild sway with M-CTSIB conditions EO/foam surface and EC/foam surface to improve ability to balance on compliant surfaces.     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 Worked on gait training on grass and other uneven surfaces, ascending/descending curbs and stairs with SPC today. Patient required cues for SPC sequencing with stairs but appeared fairly steady today. Required cues for increased toe clearance, especially on R foot with ambulation on uneven surfaces. Continued to work on   dynamic balance with patient demonstrating some trouble with SLS and stepping forward/band. Good use of hip strategy with perturbations; tendency for LOB posteriorly. Ended session with no complaints. Advised patient to return to performing HEP consistently as he reports his ear is feeling better. Patient reported understanding.     PT  Treatment/Interventions  ADLs/Self Care Home Management;Cryotherapy;Electrical Stimulation;Moist Heat;Ultrasound;DME Instruction;Stair training;Gait training;Functional mobility training;Therapeutic activities;Therapeutic exercise;Manual techniques;Orthotic Fit/Training;Patient/family education;Neuromuscular re-education;Balance training;Passive range of motion;Dry needling;Energy conservation;Splinting;Taping    PT Next Visit Plan  anterior and lateral step ups, update HEP    Consulted and Agree with Plan of Care  Patient       Patient will benefit from skilled therapeutic intervention in order to improve the following deficits and impairments:  Abnormal gait, Pain, Decreased strength, Decreased knowledge of use of DME, Decreased activity tolerance, Decreased balance, Decreased mobility, Difficulty walking, Improper body mechanics, Decreased range of motion, Decreased safety awareness, Impaired flexibility, Postural dysfunction  Visit Diagnosis: Unsteadiness on feet  Other abnormalities of gait and mobility  Other symptoms and signs involving the musculoskeletal system     Problem List Patient Active Problem List   Diagnosis Date Noted  . Mild cognitive impairment 03/11/2017  . Memory loss 12/08/2016  . Encounter for long-term (current) use of high-risk medication 02/14/2015  . Essential thrombocythemia (HCC) 06/09/2012  . Kidney stone 09/22/2011    Yevgeniya Kovalenko, PT, DPT 04/05/18 12:06 PM   [facility] [place] [address]  [address] [place], [place], [address] Phone: [phone]   Fax:  [phone]  Name: Josias B Moye MRN: [id number] Date of Birth: 07/30/1940  PHYSICAL THERAPY DISCHARGE SUMMARY  Visits from Start of Care: 5  Current functional level related to goals / functional outcomes: Patient's wife called, requesting to cancel all appts d/t patient not feeling well   Remaining deficits: Unable to  assess   Education / Equipment: HEP  Plan: Patient agrees to discharge.  Patient goals were partially met. Patient is being discharged due to a change in medical status.  ?????     Yevgeniya Kovalenko, PT, DPT 04/14/18 3:31 PM  

## 2018-04-11 ENCOUNTER — Encounter

## 2018-04-11 ENCOUNTER — Ambulatory Visit: Payer: Medicare Other | Admitting: Neurology

## 2018-04-12 ENCOUNTER — Encounter: Payer: Medicare Other | Admitting: Physical Therapy

## 2018-04-15 ENCOUNTER — Other Ambulatory Visit: Payer: Self-pay | Admitting: Physician Assistant

## 2018-04-15 DIAGNOSIS — R1310 Dysphagia, unspecified: Secondary | ICD-10-CM

## 2018-04-19 ENCOUNTER — Other Ambulatory Visit: Payer: Medicare Other

## 2018-04-19 ENCOUNTER — Ambulatory Visit: Payer: Medicare Other | Admitting: Physical Therapy

## 2018-04-20 ENCOUNTER — Ambulatory Visit
Admission: RE | Admit: 2018-04-20 | Discharge: 2018-04-20 | Disposition: A | Discharge status: Home / Self Care | Payer: Medicare Other | Source: Ambulatory Visit | Attending: Physician Assistant | Admitting: Physician Assistant

## 2018-04-20 DIAGNOSIS — R1310 Dysphagia, unspecified: Secondary | ICD-10-CM

## 2018-04-21 ENCOUNTER — Ambulatory Visit
Admission: RE | Admit: 2018-04-21 | Discharge: 2018-04-21 | Disposition: A | Discharge status: Home / Self Care | Payer: Medicare Other | Source: Ambulatory Visit | Attending: Physician Assistant | Admitting: Physician Assistant

## 2018-04-21 ENCOUNTER — Other Ambulatory Visit: Payer: Self-pay | Admitting: Physician Assistant

## 2018-04-21 DIAGNOSIS — J90 Pleural effusion, not elsewhere classified: Secondary | ICD-10-CM

## 2018-04-26 ENCOUNTER — Ambulatory Visit: Payer: Medicare Other | Admitting: Emergency Medicine

## 2018-04-26 ENCOUNTER — Encounter: Payer: Self-pay | Admitting: Emergency Medicine

## 2018-04-26 DIAGNOSIS — J90 Pleural effusion, not elsewhere classified: Secondary | ICD-10-CM

## 2018-04-26 NOTE — Addendum Note (Signed)
Addended by: Desmond Dike C on: 04/26/2018 04:48 PM   Modules accepted: Orders

## 2018-04-26 NOTE — Patient Instructions (Signed)
We will arrange for a left thoracentesis in interventional radiology We will perform a CT scan of your chest immediately following the thoracentesis so that we can better visualize the left lung and left pleural space. Follow with Dr Lamonte Sakai next available after the testing to review together.

## 2018-04-26 NOTE — Assessment & Plan Note (Signed)
Large left pleural effusion etiology unclear.  He does have hypertension which is fairly well-controlled.  He has been dealing with abdominal discomfort that is been presumed to be GERD but consider pancreatic in nature, could cause a left pleural effusion.  He had a trauma in October while he was in [place], consider hemothorax.  I reviewed the differential diagnosis with him including possible malignancy.  I recommended a thoracentesis and then a CT scan of his chest to visualize his parenchyma more effectively.  He understands and agrees.  We will arrange.

## 2018-04-26 NOTE — Progress Notes (Signed)
Subjective:    Patient ID: Jordan Potts, male    DOB: 04-29-1940, 78 y.o.   MRN: [id number]  HPI 78 year old man, minimal previous tobacco history, has a history of hypertension, chronic myeloproliferative disorder on hydroxyurea, renal calculi, dementia, remote colectomy.  He is referred today for evaluation of a left pleural effusion that was first discovered on esophagram 2/12 and then subsequently confirmed on chest x-ray from 04/21/2018 which I reviewed. He has been c/o some progressive SOB w exertion for about a month.   He had a Ct chest in [place] in October after a fall> did not have an effusion then per their report. No rib fx reported.    Review of Systems  Constitutional: Positive for unexpected weight change. Negative for fever.  HENT: Positive for sneezing and trouble swallowing. Negative for congestion, dental problem, ear pain, nosebleeds, postnasal drip, rhinorrhea, sinus pressure and sore throat.   Eyes: Positive for redness and itching.  Respiratory: Positive for shortness of breath. Negative for cough, chest tightness and wheezing.   Cardiovascular: Negative for palpitations and leg swelling.  Gastrointestinal: Positive for nausea. Negative for vomiting.  Genitourinary: Positive for dysuria.  Musculoskeletal: Negative for joint swelling.  Skin: Negative for rash.  Allergic/Immunologic: Negative.  Negative for environmental allergies, food allergies and immunocompromised state.  Neurological: Positive for headaches.  Hematological: Bruises/bleeds easily.  Psychiatric/Behavioral: Negative for dysphoric mood. The patient is not nervous/anxious.    Past Medical History:  Diagnosis Date  . Chronic myeloproliferative disease   . Hypertension   . Kidney stone    history of kidney stone  . Memory loss   . Seborrhea   . Thrombocytosis ([place])      Family History  Adopted: Yes  Problem Relation Age of Onset  . Other Mother        MVA - age 76  . Other Father     unknown     Social History   Socioeconomic History  . Marital status: Married    Spouse name: Izora Gala  . Number of children: 2  . Years of education: HS plus one year technical school  . Highest education level: Not on file  Occupational History  . Occupation: Retired  Scientific laboratory technician  . Financial resource strain: Not on file  . Food insecurity:    Worry: Not on file    Inability: Not on file  . Transportation needs:    Medical: Not on file    Non-medical: Not on file  Tobacco Use  . Smoking status: Former Smoker    Packs/day: 1.00    Years: 5.00    Pack years: 5.00    Types: Cigarettes    Last attempt to quit: 1970    Years since quitting: 50.1  . Smokeless tobacco: Never Used  Substance and Sexual Activity  . Alcohol use: No  . Drug use: No  . Sexual activity: Not on file  Lifestyle  . Physical activity:    Days per week: Not on file    Minutes per session: Not on file  . Stress: Not on file  Relationships  . Social connections:    Talks on phone: Not on file    Gets together: Not on file    Attends religious service: Not on file    Active member of club or organization: Not on file    Attends meetings of clubs or organizations: Not on file    Relationship status: Not on file  . Intimate partner violence:  Fear of current or ex partner: Not on file    Emotionally abused: Not on file    Physically abused: Not on file    Forced sexual activity: Not on file  Other Topics Concern  . Not on file  Social History Narrative   Lives at home with his wife.   Right-handed.   2-3 cups caffeine per day.     No Known Allergies   Outpatient Medications Prior to Visit  Medication Sig Dispense Refill  . acetaminophen (TYLENOL) 500 MG tablet Take 1,000 mg by mouth as needed.    Marland Kitchen amLODipine (NORVASC) 5 MG tablet Take 5 mg by mouth daily.    Marland Kitchen aspirin 81 MG tablet Take 81 mg by mouth daily.    Marland Kitchen donepezil (ARICEPT) 10 MG tablet TAKE 1 TABLET BY MOUTH AT BEDTIME 90  tablet 3  . hydroxyurea (HYDREA) 500 MG capsule TAKE 2 CAPSULES BY MOUTH DAILY 60 capsule 4  . losartan (COZAAR) 100 MG tablet Take 1 tablet by mouth daily.    . memantine (NAMENDA) 10 MG tablet TAKE 1 TABLET BY MOUTH TWICE DAILY (Patient not taking: Reported on 04/26/2018) 180 tablet 3   No facility-administered medications prior to visit.         Objective:   Physical Exam Vitals:   04/26/18 1544  BP: 128/64  Pulse: 71  SpO2: 96%  Weight: 172 lb (78 kg)  Height: 5\' 11"  (1.803 m)   Gen: Pleasant elderly man, well-nourished, in no distress,  normal affect  ENT: No lesions,  mouth clear,  oropharynx clear, no postnasal drip  Neck: No JVD, no stridor  Lungs: No use of accessory muscles, barely any air movement on the left.  Right is clear.  Dullness to percussion 2/3 the way up on the left  Cardiovascular: RRR, heart sounds normal, no murmur or gallops, no peripheral edema  Musculoskeletal: No deformities, no cyanosis or clubbing  Neuro: alert, awake, non focal  Skin: Warm, no lesions or rash      Assessment & Plan:  Pleural effusion, left Large left pleural effusion etiology unclear.  He does have hypertension which is fairly well-controlled.  He has been dealing with abdominal discomfort that is been presumed to be GERD but consider pancreatic in nature, could cause a left pleural effusion.  He had a trauma in October while he was in [place], consider hemothorax.  I reviewed the differential diagnosis with him including possible malignancy.  I recommended a thoracentesis and then a CT scan of his chest to visualize his parenchyma more effectively.  He understands and agrees.  We will arrange.    Baltazar Apo, MD, PhD 04/26/2018, 4:32 PM [facility] [phone] or if no answer [phone]

## 2018-04-26 NOTE — Addendum Note (Signed)
Addended by: Suzzanne Cloud E on: 04/26/2018 04:57 PM   Modules accepted: Orders

## 2018-04-27 LAB — BASIC METABOLIC PANEL
BUN: 19 mg/dL (ref 6–23)
CALCIUM: 9.3 mg/dL (ref 8.4–10.5)
CO2: 30 meq/L (ref 19–32)
Chloride: 103 mEq/L (ref 96–112)
Creatinine, Ser: 1.17 mg/dL (ref 0.40–1.50)
GFR: 60.32 mL/min (ref >60.00)
Glucose, Bld: 95 mg/dL (ref 70–99)
Potassium: 4.1 mEq/L (ref 3.5–5.1)
SODIUM: 140 meq/L (ref 135–145)

## 2018-04-27 LAB — LACTATE DEHYDROGENASE: LDH: 132 U/L (ref 120–250)

## 2018-04-27 LAB — PROTEIN, TOTAL: Total Protein: 7.6 g/dL (ref 6.0–8.3)

## 2018-04-29 ENCOUNTER — Telehealth: Payer: Self-pay | Admitting: Emergency Medicine

## 2018-04-29 NOTE — Telephone Encounter (Signed)
Spoke with pt's spouse Izora Gala, states that pt's PCP put pt on Levaquin and has 3 more days left of rx.  Izora Gala wants to know if pt should take the last dose of Levaquin on Monday before his thoracentesis.  appt notes for thoracentesis state liquids only 4 hours prior to procedure.  I advised pt's wife to have pt take last dose of Levaquin after the procedure on Monday.  Wife expressed understanding.  Nothing further needed at this time- will close encounter.

## 2018-05-02 ENCOUNTER — Ambulatory Visit (HOSPITAL_COMMUNITY)
Admission: RE | Admit: 2018-05-02 | Discharge: 2018-05-02 | Disposition: A | Discharge status: Home / Self Care | Payer: Medicare Other | Source: Ambulatory Visit | Attending: Emergency Medicine | Admitting: Emergency Medicine

## 2018-05-02 ENCOUNTER — Encounter (HOSPITAL_COMMUNITY): Payer: Self-pay | Admitting: Student

## 2018-05-02 ENCOUNTER — Telehealth: Payer: Self-pay | Admitting: Emergency Medicine

## 2018-05-02 DIAGNOSIS — J90 Pleural effusion, not elsewhere classified: Secondary | ICD-10-CM | POA: Diagnosis not present

## 2018-05-02 DIAGNOSIS — J9811 Atelectasis: Secondary | ICD-10-CM | POA: Diagnosis not present

## 2018-05-02 HISTORY — PX: IR THORACENTESIS ASP PLEURAL SPACE W/IMG GUIDE: IMG5380

## 2018-05-02 LAB — BODY FLUID CELL COUNT WITH DIFFERENTIAL
EOS FL: 1 %
Lymphs, Fluid: 67 %
MONOCYTE-MACROPHAGE-SEROUS FLUID: 7 % — AB (ref 50–90)
NEUTROPHIL FLUID: 25 % (ref 0–25)
WBC FLUID: 516 uL (ref 0–1000)

## 2018-05-02 LAB — GRAM STAIN

## 2018-05-02 LAB — PROTEIN, PLEURAL OR PERITONEAL FLUID: Total protein, fluid: 3.6 g/dL

## 2018-05-02 LAB — LACTATE DEHYDROGENASE, PLEURAL OR PERITONEAL FLUID: LD, Fluid: 919 U/L — ABNORMAL HIGH (ref 3–23)

## 2018-05-02 LAB — GLUCOSE, PLEURAL OR PERITONEAL FLUID

## 2018-05-02 MED ORDER — IOHEXOL 300 MG/ML  SOLN
75.0000 mL | Freq: Once | INTRAMUSCULAR | Status: AC | PRN
  Administered 2018-05-02: 75 mL via INTRAVENOUS

## 2018-05-02 MED ORDER — LIDOCAINE HCL 1 % IJ SOLN
INTRAMUSCULAR | Status: AC
  Filled 2018-05-02: qty 20

## 2018-05-02 MED ORDER — LIDOCAINE HCL 1 % IJ SOLN
INTRAMUSCULAR | Status: DC | PRN
  Administered 2018-05-02: 10 mL

## 2018-05-02 NOTE — Progress Notes (Signed)
PROCEDURE SUMMARY:  Successful US guided left diagnostic and therapeutic thoracentesis. Yielded 1.0 liters of dark, amber fluid. Patient developed acute pain and hypotension during procedure.  Procedure was stopped.  Patient was repositioned and both pain and blood pressure improved.  No immediate complications.  Specimen was sent for labs. Patient sent for Chest CT per Dr. Agustina Caroli order.  EBL < 5 mL  Docia Barrier PA-C 05/02/2018 11:01 AM

## 2018-05-02 NOTE — Telephone Encounter (Signed)
I think he should try either OTC tylenol or ibuprofen

## 2018-05-02 NOTE — Telephone Encounter (Signed)
Spoke with the pt's spouse Izora Gala and notified of recs per RB  She verbalized understanding

## 2018-05-02 NOTE — Telephone Encounter (Signed)
Called and spoke with Patients Wife Izora Gala.  She stated that Tramadol is making the Patient sick to his stomach.  She stated that she took it Friday, and Saturday, and both times 2 hours afterwards, he has vomited.  She stated that he has taken it with applesauce.  She stated that he is not eating much at all.  She was wanting to know if there is something besides Tramadol he can be prescribed for pain.  He is scheduled for OV 05/03/18, at 0930, for thoracentesis follow up.  Message routed to Dr Lamonte Sakai

## 2018-05-03 ENCOUNTER — Ambulatory Visit: Payer: Medicare Other | Admitting: Emergency Medicine

## 2018-05-03 ENCOUNTER — Encounter: Payer: Self-pay | Admitting: Emergency Medicine

## 2018-05-03 DIAGNOSIS — J90 Pleural effusion, not elsewhere classified: Secondary | ICD-10-CM

## 2018-05-03 NOTE — Progress Notes (Signed)
Subjective:    Patient ID: Jordan Potts, male    DOB: May 06, 1940, 78 y.o.   MRN: [id number]  HPI 78 year old man, minimal previous tobacco history, has a history of hypertension, chronic myeloproliferative disorder on hydroxyurea, renal calculi, dementia, remote colectomy.  He is referred today for evaluation of a left pleural effusion that was first discovered on esophagram 2/12 and then subsequently confirmed on chest x-ray from 04/21/2018 which I reviewed. He has been c/o some progressive SOB w exertion for about a month.   He had a Ct chest in [place] in October after a fall> did not have an effusion then per their report. No rib fx reported.   ROV 05/03/2018 --patient follows up today for continued work-up of an unexplained left pleural effusion.  We arrange for thoracentesis which was done on 2/24.  The fluids consistent with an exudate, 67% lymphocytes, culture and cytology pending.  A CT scan of his chest was done immediately after which I have reviewed.  This shows no central obstructive lesion, bilateral calcified pleural plaques, irregular circumferential left pleural thickening including along the mediastinal surface concerning for possible mesothelioma.  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 He has also worked in [employer], [employer].    Review of Systems  Constitutional: Positive for unexpected weight change. Negative for fever.  HENT: Positive for sneezing and trouble swallowing. Negative for congestion, dental problem, ear pain, nosebleeds, postnasal drip, rhinorrhea, sinus pressure and sore throat.   Eyes: Positive for redness and itching.  Respiratory: Positive for shortness of breath. Negative for cough, chest tightness and wheezing.   Cardiovascular: Negative for palpitations and leg swelling.  Gastrointestinal: Positive for nausea. Negative for vomiting.  Genitourinary:  Positive for dysuria.  Musculoskeletal: Negative for joint swelling.  Skin: Negative for rash.  Allergic/Immunologic: Negative.  Negative for environmental allergies, food allergies and immunocompromised state.  Neurological: Positive for headaches.  Hematological: Bruises/bleeds easily.  Psychiatric/Behavioral: Negative for dysphoric mood. The patient is not nervous/anxious.    Past Medical History:  Diagnosis Date  . Chronic myeloproliferative disease   . Hypertension   . Kidney stone    history of kidney stone  . Memory loss   . Seborrhea   . Thrombocytosis ([place])      Family History  Adopted: Yes  Problem Relation Age of Onset  . Other Mother        MVA - age 45  . Other Father        unknown     Social History   Socioeconomic History  . Marital status: Married    Spouse name: Izora Gala  . Number of children: 2  . Years of education: HS plus one year technical school  . Highest education level: Not on file  Occupational History  . Occupation: Retired  Scientific laboratory technician  . Financial resource strain: Not on file  . Food insecurity:    Worry: Not on file    Inability: Not on file  . Transportation needs:    Medical: Not on file    Non-medical: Not on file  Tobacco Use  . Smoking status: Former Smoker    Packs/day: 1.00    Years: 5.00    Pack years: 5.00    Types: Cigarettes    Last attempt to quit: 1970    Years since quitting: 50.1  . Smokeless tobacco: Never Used  Substance and Sexual Activity  .  Alcohol use: No  . Drug use: No  . Sexual activity: Not on file  Lifestyle  . Physical activity:    Days per week: Not on file    Minutes per session: Not on file  . Stress: Not on file  Relationships  . Social connections:    Talks on phone: Not on file    Gets together: Not on file    Attends religious service: Not on file    Active member of club or organization: Not on file    Attends meetings of clubs or organizations: Not on file    Relationship status:  Not on file  . Intimate partner violence:    Fear of current or ex partner: Not on file    Emotionally abused: Not on file    Physically abused: Not on file    Forced sexual activity: Not on file  Other Topics Concern  . Not on file  Social History Narrative   Lives at home with his wife.   Right-handed.   2-3 cups caffeine per day.     No Known Allergies   Outpatient Medications Prior to Visit  Medication Sig Dispense Refill  . acetaminophen (TYLENOL) 500 MG tablet Take 1,000 mg by mouth as needed.    Marland Kitchen amLODipine (NORVASC) 5 MG tablet Take 5 mg by mouth daily.    Marland Kitchen aspirin 81 MG tablet Take 81 mg by mouth daily.    Marland Kitchen donepezil (ARICEPT) 10 MG tablet TAKE 1 TABLET BY MOUTH AT BEDTIME 90 tablet 3  . hydroxyurea (HYDREA) 500 MG capsule TAKE 2 CAPSULES BY MOUTH DAILY 60 capsule 4  . losartan (COZAAR) 100 MG tablet Take 1 tablet by mouth daily.    . memantine (NAMENDA) 10 MG tablet TAKE 1 TABLET BY MOUTH TWICE DAILY 180 tablet 3   No facility-administered medications prior to visit.         Objective:   Physical Exam Vitals:   05/03/18 0926  BP: 114/62  Pulse: 68  SpO2: 98%  Weight: 165 lb (74.8 kg)  Height: 5\' 11"  (1.803 m)   Gen: Pleasant elderly man, well-nourished, in no distress,  normal affect  ENT: No lesions,  mouth clear,  oropharynx clear, no postnasal drip  Neck: No JVD, no stridor  Lungs: No use of accessory muscles, barely any air movement on the left.  Right is clear.  Dullness to percussion 2/3 the way up on the left  Cardiovascular: RRR, heart sounds normal, no murmur or gallops, no peripheral edema  Musculoskeletal: No deformities, no cyanosis or clubbing  Neuro: alert, awake, non focal  Skin: Warm, no lesions or rash      Assessment & Plan:  Pleural effusion, left His pleural fluid was an exudate, lymphocyte predominant.  The CT chest shows pleural plaquing and thickening and irregularity of the left-sided pleura.  The entire clinical  picture is concerning for mesothelioma, particularly with the mediastinal pleural plaquing.  This could represent a benign asbestos related pleural effusion.  If so then there should be spontaneous resolution in 6 to 12 months.  We need to follow his cytology.  If negative then it would probably be reasonable to expand the work-up to ensure no evidence for mesothelioma.  This may include a repeat thoracentesis or possibly even referral for pleuroscopy, VATS.    Baltazar Apo, MD, PhD 05/03/2018, 12:33 PM [facility] [phone] or if no answer [phone]

## 2018-05-03 NOTE — Patient Instructions (Signed)
We will review your pleural fluid cytology results when they are available. Depending on the results we will decide whether any other testing is necessary.  This might include a repeat thoracentesis or pleuroscopy. Follow with Dr Lamonte Sakai in 1 month

## 2018-05-03 NOTE — Assessment & Plan Note (Signed)
His pleural fluid was an exudate, lymphocyte predominant.  The CT chest shows pleural plaquing and thickening and irregularity of the left-sided pleura.  The entire clinical picture is concerning for mesothelioma, particularly with the mediastinal pleural plaquing.  This could represent a benign asbestos related pleural effusion.  If so then there should be spontaneous resolution in 6 to 12 months.  We need to follow his cytology.  If negative then it would probably be reasonable to expand the work-up to ensure no evidence for mesothelioma.  This may include a repeat thoracentesis or possibly even referral for pleuroscopy, VATS.

## 2018-05-04 LAB — CHOLESTEROL, BODY FLUID: Cholesterol, Fluid: 46 mg/dL

## 2018-05-05 ENCOUNTER — Telehealth: Payer: Self-pay | Admitting: Emergency Medicine

## 2018-05-05 DIAGNOSIS — J90 Pleural effusion, not elsewhere classified: Secondary | ICD-10-CM

## 2018-05-05 NOTE — Telephone Encounter (Signed)
Spoke with the pt's spouse  She is requesting pt's ct chest results from 05/02/2018  She would like these before the weekend If possible  Will forward to [facility]  Thanks

## 2018-05-06 ENCOUNTER — Telehealth: Payer: Self-pay | Admitting: Emergency Medicine

## 2018-05-06 DIAGNOSIS — J90 Pleural effusion, not elsewhere classified: Secondary | ICD-10-CM

## 2018-05-06 NOTE — Telephone Encounter (Signed)
I forgot to mention this -  If the family and patient are OK with repeating the thoracentesis, please make sure that cytology is ordered on the fluid. Thanks.

## 2018-05-06 NOTE — Telephone Encounter (Signed)
Please let them know that the cytology is back > there are no cancer cells in the pleural fluid. This is good news. We will have to decide how to proceed. I believe that it would be reasonable to repeat his thoracentesis before we would commit him to any other kind of more invasive diagnostic procedure. If they would be willing to repeat the thoracentesis, then please let them know that we will arrange for them through IR

## 2018-05-06 NOTE — Telephone Encounter (Signed)
Placed another order, Dr Lamonte Sakai wanted cytology for the fluid.  Will route to [facility] pool as the procedure was already scheduled.

## 2018-05-06 NOTE — Telephone Encounter (Signed)
Jordan Potts called again. Advised her that RB is currently not available. She is requesting results from patient's CT scan and thoracentesis.   RB, please advise. Thanks!

## 2018-05-06 NOTE — Telephone Encounter (Signed)
Called and spoke with Patient's Wife, Izora Gala.  Dr Agustina Caroli recommendations given. Understanding stated. Izora Gala agreed with going forward with thoracentesis. US guided thoracentesis order placed.  Nothing further at this time.

## 2018-05-07 LAB — CULTURE, BODY FLUID-BOTTLE: CULTURE: NO GROWTH

## 2018-05-07 LAB — CULTURE, BODY FLUID W GRAM STAIN -BOTTLE

## 2018-05-09 NOTE — Telephone Encounter (Signed)
Spoke to [place] in [employer].  She linked new order to thoracentesis so cytology will be done on fluid.  Nothing further needed.

## 2018-05-10 ENCOUNTER — Ambulatory Visit (HOSPITAL_COMMUNITY)
Admission: RE | Admit: 2018-05-10 | Discharge: 2018-05-10 | Disposition: A | Discharge status: Home / Self Care | Payer: Medicare Other | Source: Ambulatory Visit | Attending: Physician Assistant | Admitting: Physician Assistant

## 2018-05-10 ENCOUNTER — Other Ambulatory Visit (HOSPITAL_COMMUNITY): Payer: Self-pay | Admitting: Physician Assistant

## 2018-05-10 ENCOUNTER — Encounter (HOSPITAL_COMMUNITY): Payer: Self-pay | Admitting: Physician Assistant

## 2018-05-10 ENCOUNTER — Ambulatory Visit (HOSPITAL_COMMUNITY)
Admission: RE | Admit: 2018-05-10 | Discharge: 2018-05-10 | Disposition: A | Discharge status: Home / Self Care | Payer: Medicare Other | Source: Ambulatory Visit | Attending: Emergency Medicine | Admitting: Emergency Medicine

## 2018-05-10 ENCOUNTER — Telehealth: Payer: Self-pay | Admitting: Emergency Medicine

## 2018-05-10 DIAGNOSIS — J90 Pleural effusion, not elsewhere classified: Secondary | ICD-10-CM

## 2018-05-10 HISTORY — PX: IR THORACENTESIS ASP PLEURAL SPACE W/IMG GUIDE: IMG5380

## 2018-05-10 MED ORDER — LIDOCAINE HCL 1 % IJ SOLN
INTRAMUSCULAR | Status: AC
  Filled 2018-05-10: qty 20

## 2018-05-10 NOTE — Procedures (Signed)
PROCEDURE SUMMARY:  Successful image-guided left thoracentesis. Yielded 800 milliliters of amber fluid. Patient tolerated procedure well - procedure was aborted at patient's request due to left shoulder pain. BP remained stable throughout. EBL: Zero No immediate complications.  Specimen was sent for labs. Post procedure CXR shows no pneumothorax.  Please see imaging section of Epic for full dictation.  Joaquim Nam PA-C 05/10/2018 9:30 AM

## 2018-05-10 NOTE — Telephone Encounter (Signed)
Spoke with pt's wife, she states he is still in pain. It got better after the fluid was taken off but now it started again this morning after the procedure. She states the Tylenol 500mg  TID is not working for him and just wants to know what to do. RB please advise on the next step. Do you want him to be seen this afternoon?

## 2018-05-10 NOTE — Telephone Encounter (Signed)
Please let them know that it takes probably about 2 days for the cytology to come back.  We should have that result before the end of the week and we will call him to review.  Based on our previous conversations I do not believe he tolerated or benefited from Ultram, suspect that he would have difficulty with related narcotics.  I think the best thing for him to do would be to try using NSAIDs.  Please have him try ibuprofen 600 mg up to every 6 hours if he needs it for his discomfort.  If this persists then we may be forced to try alternative pain medication

## 2018-05-10 NOTE — Telephone Encounter (Signed)
Spoke with pt's wife and advised messae from [place]. She understood and will try the ibuprofen 600mg  every 6 hours for now. Nothing further is needed.

## 2018-05-13 ENCOUNTER — Telehealth: Payer: Self-pay | Admitting: Emergency Medicine

## 2018-05-13 DIAGNOSIS — J90 Pleural effusion, not elsewhere classified: Secondary | ICD-10-CM

## 2018-05-13 NOTE — Telephone Encounter (Signed)
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 His lung has re-inflated but only partially because there is still some fluid present. I agree that the pain is due to pleural inflammation.   Based on the fact that he is still experiencing pain, I believe the next step is to have him see the Cardioth[facility]acic Surgeons to discuss a procedure to examine the pleural space. I was going to discus this with him at our next visit. If he agrees with me and would like for me to make this referral then lets go ahead and do so.

## 2018-05-13 NOTE — Telephone Encounter (Signed)
Spoke with the pt's spouse  She is asking if RB can tell her what caused his pleural effusion  She states that she also needs to know "if his lung ever re-inflated"  She states pt still having shoulder pain and having to take advil and tylenol  Pain is no better or worse since his last visit here, but she is concerned that this is ongoing  She believes that this is coming from past thoracentesis  Please advise thanks!

## 2018-05-16 NOTE — Telephone Encounter (Signed)
Called pt and spoke with wife Izora Gala letting her know the information stated by Dr. Lamonte Sakai. After relaying the info to Izora Gala, she stated to go ahead and place order for cardiothoracic surgery.  Order has been placed. Nothing further needed.

## 2018-05-17 ENCOUNTER — Ambulatory Visit
Admission: RE | Admit: 2018-05-17 | Discharge: 2018-05-17 | Disposition: A | Discharge status: Home / Self Care | Payer: Medicare Other | Source: Ambulatory Visit | Attending: Thoracic Surgery (Cardiothoracic Vascular Surgery) | Admitting: Thoracic Surgery (Cardiothoracic Vascular Surgery)

## 2018-05-17 ENCOUNTER — Institutional Professional Consult (permissible substitution): Payer: Medicare Other | Admitting: Thoracic Surgery (Cardiothoracic Vascular Surgery)

## 2018-05-17 ENCOUNTER — Other Ambulatory Visit: Payer: Self-pay

## 2018-05-17 ENCOUNTER — Other Ambulatory Visit: Payer: Self-pay | Admitting: *Deleted

## 2018-05-17 ENCOUNTER — Encounter: Payer: Self-pay | Admitting: Thoracic Surgery (Cardiothoracic Vascular Surgery)

## 2018-05-17 VITALS — BP 139/71 | HR 85 | Resp 18 | Ht 71.0 in | Wt 166.0 lb

## 2018-05-17 DIAGNOSIS — J90 Pleural effusion, not elsewhere classified: Secondary | ICD-10-CM

## 2018-05-17 NOTE — Progress Notes (Signed)
PCP is Hulan Fess, MD Referring Provider is Collene Gobble, MD  Chief Complaint  Patient presents with  . Pleural Effusion    f/u with Chest CT 05/02/2018, xray/thoracentsis 05/10/2018.Marland KitchenMarland KitchenREPEAT CXR TODAY    HPI: Jordan Potts is in for consultation regarding a left pleural effusion.  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  He was being evaluated for left upper quadrant abdominal discomfort.  An esophagram showed a large left pleural effusion.  A chest x-ray confirmed the pleural effusion.  He underwent thoracentesis on 2 occasions with 1 L drained on the first and 800 mL on the second.  Cytologies were negative from both.  LDH was markedly elevated findings were consistent with an exudate.  A CT of the chest showed a large pleural effusion.  There was some thickening of the parietal pleura and some calcification concerning for possible asbestos related disease.  There is no definite mass of the pleura.  He complains of poor appetite and weight loss.  He had a syncopal spell back in October when he was in [place].  The work-up for that was negative for underlying cardiac cause.  He says that he had some pain on his right side but did not have any injury to his left chest.  He does have some difficulty walking.  Per his wife he has some significant memory issues.  He denies feeling short of breath but his wife says she notices him breathing hard even with minimal activities.  Zubrod Score: At the time of surgery this patient's most appropriate activity status/level should be described as: []     0    Normal activity, no symptoms []     1    Restricted in physical strenuous activity but ambulatory, able to do out light work [x]     2    Ambulatory and capable of self care, unable to do work activities, up and about >50 % of waking hours                               []     3    Only limited self care, in bed greater than 50% of waking hours []     4    Completely disabled, no self care, confined to bed or chair []     5    Moribund  Past Medical History:  Diagnosis Date  . Chronic myeloproliferative disease   . Hypertension   . Kidney stone    history of kidney stone  . Memory loss   . Seborrhea   . Thrombocytosis ([place])     Past Surgical History:  Procedure Laterality Date  . COLECTOMY  1995   S/P Colectomy for diverticulitis and hypertension.  . IR THORACENTESIS ASP PLEURAL SPACE W/IMG GUIDE  05/02/2018  . IR THORACENTESIS ASP PLEURAL SPACE W/IMG GUIDE  05/10/2018  . KIDNEY STONE SURGERY    . TONSILLECTOMY      Family History  Adopted: Yes  Problem Relation Age of Onset  . Other Mother        MVA - age 60  . Other Father        unknown    Social History Social History   Tobacco Use  . Smoking status: Former Smoker    Packs/day: 1.00    Years: 5.00    Pack years: 5.00  Types: Cigarettes    Last attempt to quit: 1970    Years since quitting: 50.2  . Smokeless tobacco: Never Used  Substance Use Topics  . Alcohol use: No  . Drug use: No    Current Outpatient Medications  Medication Sig Dispense Refill  . acetaminophen (TYLENOL) 500 MG tablet Take 1,000 mg by mouth as needed.    Marland Kitchen amLODipine (NORVASC) 5 MG tablet Take 5 mg by mouth daily.    Marland Kitchen aspirin 81 MG tablet Take 81 mg by mouth daily.    Marland Kitchen donepezil (ARICEPT) 10 MG tablet TAKE 1 TABLET BY MOUTH AT BEDTIME 90 tablet 3  . hydroxyurea (HYDREA) 500 MG capsule TAKE 2 CAPSULES BY MOUTH DAILY 60 capsule 4  . losartan (COZAAR) 100 MG tablet Take 1 tablet by mouth daily.    . memantine (NAMENDA) 10 MG tablet TAKE 1 TABLET BY MOUTH TWICE DAILY 180 tablet 3   No current facility-administered medications for this visit.     No Known Allergies  Review of Systems  Constitutional: Positive for activity change, fatigue and unexpected weight change (10 pound weight  loss 3 months). Negative for chills and fever.  HENT: Positive for hearing loss.   Eyes: Negative for visual disturbance.  Respiratory: Positive for shortness of breath. Negative for cough and wheezing.   Cardiovascular: Negative for chest pain.  Gastrointestinal: Positive for abdominal pain (Reflux and left upper quadrant pain), constipation and diarrhea.  Genitourinary: Negative for difficulty urinating and dysuria.  Musculoskeletal: Positive for gait problem.  Neurological: Positive for syncope. Negative for seizures and weakness.       Memory loss with dementia  Psychiatric/Behavioral: The patient is nervous/anxious.   All other systems reviewed and are negative.   BP 139/71 (BP Location: Left Arm, Patient Position: Sitting, Cuff Size: Normal)   Pulse 85   Resp 18   Ht 5\' 11"  (1.803 m)   Wt 166 lb (75.3 kg)   SpO2 95% Comment: RA  BMI 23.15 kg/m  Physical Exam Vitals signs reviewed.  Constitutional:      General: He is not in acute distress.    Appearance: He is ill-appearing.     Comments: Elderly  HENT:     Head: Normocephalic and atraumatic.     Mouth/Throat:     Pharynx: Oropharynx is clear.  Eyes:     General: No scleral icterus.    Extraocular Movements: Extraocular movements intact.     Conjunctiva/sclera: Conjunctivae normal.  Neck:     Musculoskeletal: Neck supple.  Cardiovascular:     Rate and Rhythm: Normal rate and regular rhythm.     Heart sounds: Normal heart sounds. No murmur. No friction rub. No gallop.   Pulmonary:     Effort: Pulmonary effort is normal. No respiratory distress.     Breath sounds: No wheezing or rales.     Comments: Absent breath sounds lower two thirds left chest Abdominal:     General: There is no distension.     Palpations: Abdomen is soft.     Tenderness: There is no abdominal tenderness.  Musculoskeletal:        General: No swelling.  Lymphadenopathy:     Cervical: No cervical adenopathy.  Skin:    General: Skin is warm  and dry.  Neurological:     General: No focal deficit present.     Mental Status: He is alert and oriented to person, place, and time.     Cranial Nerves: No cranial nerve deficit.  Psychiatric:     Comments: Flat affect    Diagnostic Tests: CT CHEST WITH CONTRAST  TECHNIQUE: Multidetector CT imaging of the chest was performed during intravenous contrast administration.  CONTRAST:  26mL OMNIPAQUE IOHEXOL 300 MG/ML  SOLN  COMPARISON:  Chest radiographs dated 04/21/2018. CT chest dated 07/17/2009.  FINDINGS: Cardiovascular: The heart is normal in size. No pericardial effusion.  No evidence of thoracic aortic aneurysm. Atherosclerotic calcifications of the aortic arch.  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  Mediastinum/Nodes: No suspicious mediastinal lymphadenopathy.  Visualized thyroid is unremarkable.  Lungs/Pleura: Compressive atelectasis of the lingula and left lower lobe. No evidence of central mass.  Large left pleural effusion. Associated irregular circumferential left pleural thickening (series 3/image 36), including along the mediastinal surface, raising concern for pleural-based malignancy such as mesothelioma. Correlate with cytology from recent thoracentesis.  Calcified pleural plaques bilaterally, right greater than left, chronic. This appearance suggests asbestos related pleural disease.  Right lung is clear.  No pneumothorax.  Upper Abdomen: Visualized upper abdomen is grossly unremarkable.  Musculoskeletal: Mild degenerative changes of the lower thoracic spine.  IMPRESSION: Irregular circumferential left pleural thickening, raising concern for pleural-based malignancy such as mesothelioma in this context. Correlate with cytology from recent thoracentesis.  Associated large left pleural effusion. Calcified pleural plaques bilaterally, right greater than left, suggesting asbestos related pleural  disease.  Compressive atelectasis of the lingula and left lower lobe. No evidence of central mass.  Aortic Atherosclerosis (ICD10-I70.0).   Electronically Signed   By: Julian Hy M.D.   On: 05/02/2018 10:59 I personally reviewed the CT chest concur with the findings noted above  Impression: Jordan Potts is a 78 year old gentleman with a past medical history of hypertension, myeloproliferative disorder with thrombocytosis, and probable early dementia.  He was being evaluated for left upper quadrant abdominal discomfort with an esophagram back in February.  He was noted to have a large left pleural effusion.  That was confirmed by chest x-ray.  Thoracentesis drained about a liter of fluid.  Findings were consistent with an exudate.  Cytology was negative.  CT of the chest showed a large pleural effusion with some pleural thickening and calcification consistent with asbestos-related disease.  He had a second thoracentesis about a week ago which drained 800 mL of fluid before it was stopped due to discomfort.  He still had a large pleural effusion post thoracentesis and his chest x-ray today shows continued reaccumulation of pleural fluid with almost 3/4 of the left chest opacified.  Differential diagnosis includes malignant versus inflammatory effusion.  He does not have any known trauma or pneumonia to explain the findings.  He is not aware of any definite asbestos exposure.  He smoked briefly but quit over 50 years ago.  Given the size of the effusion and the rapid reaccumulation after thoracentesis I do not think that additional attempts at thoracentesis are likely to be particularly helpful.  It would be nice to avoid surgery given his age and memory issues, but I really do not see a good way around that.  I think the best option would be to do a left VATS to drain the effusion.  I suspect he will need a decortication of the lung and chest wall as well.  If the lung were to be  entrapped by malignancy I would just leave a pleural catheter in place for symptomatic relief with drainage.  I described the proposed operative procedure to Jordan Potts and his wife.  They understand that  this is a major operative procedure that would require general anesthesia.  We would plan to do this in a minimally invasive manner.  We discussed the general nature of the procedure, the incisions to be used, the use of drainage tubes postoperatively, the expected hospital stay, and the overall recovery period.  I informed him of the indications, risk, benefits, and alternatives.  They understand the risk include but not limited to death, MI, DVT, PE, stroke, bleeding, possible need for transfusion, infection, prolonged air leak, irregular heart rhythms, as well as the possibility of other unforeseeable complications.  I do think he is at high risk for perioperative delirium given his memory issues and age.  They wish to think over their options and discuss the matter with their children before making a decision as to how to proceed.  They will call if he would like to proceed with surgery.  Plan: Left VATS for drainage of pleural effusion, pleural biopsies and probable decortication.  Patient will call to schedule  [name], MD [facility] [phone]

## 2018-05-17 NOTE — H&P (View-Only) (Signed)
PCP is Hulan Fess, MD Referring Provider is Collene Gobble, MD  Chief Complaint  Patient presents with  . Pleural Effusion    f/u with Chest CT 05/02/2018, xray/thoracentsis 05/10/2018.Marland KitchenMarland KitchenREPEAT CXR TODAY    HPI: Mr. Jordan Potts is in for consultation regarding a left pleural effusion.  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  He was being evaluated for left upper quadrant abdominal discomfort.  An esophagram showed a large left pleural effusion.  A chest x-ray confirmed the pleural effusion.  He underwent thoracentesis on 2 occasions with 1 L drained on the first and 800 mL on the second.  Cytologies were negative from both.  LDH was markedly elevated findings were consistent with an exudate.  A CT of the chest showed a large pleural effusion.  There was some thickening of the parietal pleura and some calcification concerning for possible asbestos related disease.  There is no definite mass of the pleura.  He complains of poor appetite and weight loss.  He had a syncopal spell back in October when he was in [place].  The work-up for that was negative for underlying cardiac cause.  He says that he had some pain on his right side but did not have any injury to his left chest.  He does have some difficulty walking.  Per his wife he has some significant memory issues.  He denies feeling short of breath but his wife says she notices him breathing hard even with minimal activities.  Zubrod Score: At the time of surgery this patient's most appropriate activity status/level should be described as: []     0    Normal activity, no symptoms []     1    Restricted in physical strenuous activity but ambulatory, able to do out light work [x]     2    Ambulatory and capable of self care, unable to do work activities, up and about >50 % of waking hours                               []     3    Only limited self care, in bed greater than 50% of waking hours []     4    Completely disabled, no self care, confined to bed or chair []     5    Moribund  Past Medical History:  Diagnosis Date  . Chronic myeloproliferative disease   . Hypertension   . Kidney stone    history of kidney stone  . Memory loss   . Seborrhea   . Thrombocytosis ([place])     Past Surgical History:  Procedure Laterality Date  . COLECTOMY  1995   S/P Colectomy for diverticulitis and hypertension.  . IR THORACENTESIS ASP PLEURAL SPACE W/IMG GUIDE  05/02/2018  . IR THORACENTESIS ASP PLEURAL SPACE W/IMG GUIDE  05/10/2018  . KIDNEY STONE SURGERY    . TONSILLECTOMY      Family History  Adopted: Yes  Problem Relation Age of Onset  . Other Mother        MVA - age 32  . Other Father        unknown    Social History Social History   Tobacco Use  . Smoking status: Former Smoker    Packs/day: 1.00    Years: 5.00    Pack years: 5.00  Types: Cigarettes    Last attempt to quit: 1970    Years since quitting: 50.2  . Smokeless tobacco: Never Used  Substance Use Topics  . Alcohol use: No  . Drug use: No    Current Outpatient Medications  Medication Sig Dispense Refill  . acetaminophen (TYLENOL) 500 MG tablet Take 1,000 mg by mouth as needed.    Marland Kitchen amLODipine (NORVASC) 5 MG tablet Take 5 mg by mouth daily.    Marland Kitchen aspirin 81 MG tablet Take 81 mg by mouth daily.    Marland Kitchen donepezil (ARICEPT) 10 MG tablet TAKE 1 TABLET BY MOUTH AT BEDTIME 90 tablet 3  . hydroxyurea (HYDREA) 500 MG capsule TAKE 2 CAPSULES BY MOUTH DAILY 60 capsule 4  . losartan (COZAAR) 100 MG tablet Take 1 tablet by mouth daily.    . memantine (NAMENDA) 10 MG tablet TAKE 1 TABLET BY MOUTH TWICE DAILY 180 tablet 3   No current facility-administered medications for this visit.     No Known Allergies  Review of Systems  Constitutional: Positive for activity change, fatigue and unexpected weight change (10 pound weight  loss 3 months). Negative for chills and fever.  HENT: Positive for hearing loss.   Eyes: Negative for visual disturbance.  Respiratory: Positive for shortness of breath. Negative for cough and wheezing.   Cardiovascular: Negative for chest pain.  Gastrointestinal: Positive for abdominal pain (Reflux and left upper quadrant pain), constipation and diarrhea.  Genitourinary: Negative for difficulty urinating and dysuria.  Musculoskeletal: Positive for gait problem.  Neurological: Positive for syncope. Negative for seizures and weakness.       Memory loss with dementia  Psychiatric/Behavioral: The patient is nervous/anxious.   All other systems reviewed and are negative.   BP 139/71 (BP Location: Left Arm, Patient Position: Sitting, Cuff Size: Normal)   Pulse 85   Resp 18   Ht 5\' 11"  (1.803 m)   Wt 166 lb (75.3 kg)   SpO2 95% Comment: RA  BMI 23.15 kg/m  Physical Exam Vitals signs reviewed.  Constitutional:      General: He is not in acute distress.    Appearance: He is ill-appearing.     Comments: Elderly  HENT:     Head: Normocephalic and atraumatic.     Mouth/Throat:     Pharynx: Oropharynx is clear.  Eyes:     General: No scleral icterus.    Extraocular Movements: Extraocular movements intact.     Conjunctiva/sclera: Conjunctivae normal.  Neck:     Musculoskeletal: Neck supple.  Cardiovascular:     Rate and Rhythm: Normal rate and regular rhythm.     Heart sounds: Normal heart sounds. No murmur. No friction rub. No gallop.   Pulmonary:     Effort: Pulmonary effort is normal. No respiratory distress.     Breath sounds: No wheezing or rales.     Comments: Absent breath sounds lower two thirds left chest Abdominal:     General: There is no distension.     Palpations: Abdomen is soft.     Tenderness: There is no abdominal tenderness.  Musculoskeletal:        General: No swelling.  Lymphadenopathy:     Cervical: No cervical adenopathy.  Skin:    General: Skin is warm  and dry.  Neurological:     General: No focal deficit present.     Mental Status: He is alert and oriented to person, place, and time.     Cranial Nerves: No cranial nerve deficit.  Psychiatric:     Comments: Flat affect    Diagnostic Tests: CT CHEST WITH CONTRAST  TECHNIQUE: Multidetector CT imaging of the chest was performed during intravenous contrast administration.  CONTRAST:  62mL OMNIPAQUE IOHEXOL 300 MG/ML  SOLN  COMPARISON:  Chest radiographs dated 04/21/2018. CT chest dated 07/17/2009.  FINDINGS: Cardiovascular: The heart is normal in size. No pericardial effusion.  No evidence of thoracic aortic aneurysm. Atherosclerotic calcifications of the aortic arch.  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  Mediastinum/Nodes: No suspicious mediastinal lymphadenopathy.  Visualized thyroid is unremarkable.  Lungs/Pleura: Compressive atelectasis of the lingula and left lower lobe. No evidence of central mass.  Large left pleural effusion. Associated irregular circumferential left pleural thickening (series 3/image 36), including along the mediastinal surface, raising concern for pleural-based malignancy such as mesothelioma. Correlate with cytology from recent thoracentesis.  Calcified pleural plaques bilaterally, right greater than left, chronic. This appearance suggests asbestos related pleural disease.  Right lung is clear.  No pneumothorax.  Upper Abdomen: Visualized upper abdomen is grossly unremarkable.  Musculoskeletal: Mild degenerative changes of the lower thoracic spine.  IMPRESSION: Irregular circumferential left pleural thickening, raising concern for pleural-based malignancy such as mesothelioma in this context. Correlate with cytology from recent thoracentesis.  Associated large left pleural effusion. Calcified pleural plaques bilaterally, right greater than left, suggesting asbestos related pleural  disease.  Compressive atelectasis of the lingula and left lower lobe. No evidence of central mass.  Aortic Atherosclerosis (ICD10-I70.0).   Electronically Signed   By: Julian Hy M.D.   On: 05/02/2018 10:59 I personally reviewed the CT chest concur with the findings noted above  Impression: Jordan Potts is a 78 year old gentleman with a past medical history of hypertension, myeloproliferative disorder with thrombocytosis, and probable early dementia.  He was being evaluated for left upper quadrant abdominal discomfort with an esophagram back in February.  He was noted to have a large left pleural effusion.  That was confirmed by chest x-ray.  Thoracentesis drained about a liter of fluid.  Findings were consistent with an exudate.  Cytology was negative.  CT of the chest showed a large pleural effusion with some pleural thickening and calcification consistent with asbestos-related disease.  He had a second thoracentesis about a week ago which drained 800 mL of fluid before it was stopped due to discomfort.  He still had a large pleural effusion post thoracentesis and his chest x-ray today shows continued reaccumulation of pleural fluid with almost 3/4 of the left chest opacified.  Differential diagnosis includes malignant versus inflammatory effusion.  He does not have any known trauma or pneumonia to explain the findings.  He is not aware of any definite asbestos exposure.  He smoked briefly but quit over 50 years ago.  Given the size of the effusion and the rapid reaccumulation after thoracentesis I do not think that additional attempts at thoracentesis are likely to be particularly helpful.  It would be nice to avoid surgery given his age and memory issues, but I really do not see a good way around that.  I think the best option would be to do a left VATS to drain the effusion.  I suspect he will need a decortication of the lung and chest wall as well.  If the lung were to be  entrapped by malignancy I would just leave a pleural catheter in place for symptomatic relief with drainage.  I described the proposed operative procedure to Mr. Mecham and his wife.  They understand that  this is a major operative procedure that would require general anesthesia.  We would plan to do this in a minimally invasive manner.  We discussed the general nature of the procedure, the incisions to be used, the use of drainage tubes postoperatively, the expected hospital stay, and the overall recovery period.  I informed him of the indications, risk, benefits, and alternatives.  They understand the risk include but not limited to death, MI, DVT, PE, stroke, bleeding, possible need for transfusion, infection, prolonged air leak, irregular heart rhythms, as well as the possibility of other unforeseeable complications.  I do think he is at high risk for perioperative delirium given his memory issues and age.  They wish to think over their options and discuss the matter with their children before making a decision as to how to proceed.  They will call if he would like to proceed with surgery.  Plan: Left VATS for drainage of pleural effusion, pleural biopsies and probable decortication.  Patient will call to schedule  [name], MD [facility] [phone]

## 2018-05-18 ENCOUNTER — Encounter: Payer: Self-pay | Admitting: *Deleted

## 2018-05-18 ENCOUNTER — Other Ambulatory Visit: Payer: Self-pay | Admitting: *Deleted

## 2018-05-18 DIAGNOSIS — J90 Pleural effusion, not elsewhere classified: Secondary | ICD-10-CM

## 2018-05-20 ENCOUNTER — Other Ambulatory Visit: Payer: Self-pay | Admitting: Thoracic Surgery (Cardiothoracic Vascular Surgery)

## 2018-05-20 ENCOUNTER — Telehealth: Payer: Self-pay

## 2018-05-20 DIAGNOSIS — J90 Pleural effusion, not elsewhere classified: Secondary | ICD-10-CM

## 2018-05-20 NOTE — Progress Notes (Unsigned)
i

## 2018-05-20 NOTE — Telephone Encounter (Signed)
-----   Message from [name], MD sent at 05/20/2018  4:01 PM EDT ----- See if you can get him a thoracentesis  [facility] ----- Message ----- From: Donnella Sham, RN Sent: 05/19/2018   3:18 PM EDT To: [name], MD  Patient's wife, Izora Gala contacted the office stating that the patient is having more pain in his chest and some shortness of breath when having conversations.  She wanted to know if there was anything "that can be done with the fluid" before his surgery date.  I told her that this is not likely other than if he has severe shortness of breath and unbearable pain to go to the ED.  But I told her I would get your thoughts.  Thanks,  Caryl Pina

## 2018-05-20 NOTE — Telephone Encounter (Signed)
Patient's wife, Izora Gala called and made aware of appointment for Thoracentesis 05/24/2018@ 0945 at [facility].

## 2018-05-24 ENCOUNTER — Encounter (HOSPITAL_COMMUNITY): Payer: Self-pay | Admitting: Physician Assistant

## 2018-05-24 ENCOUNTER — Ambulatory Visit (HOSPITAL_COMMUNITY)
Admission: RE | Admit: 2018-05-24 | Discharge: 2018-05-24 | Disposition: A | Discharge status: Home / Self Care | Payer: Medicare Other | Source: Ambulatory Visit | Attending: Physician Assistant | Admitting: Physician Assistant

## 2018-05-24 ENCOUNTER — Other Ambulatory Visit (HOSPITAL_COMMUNITY): Payer: Self-pay | Admitting: Physician Assistant

## 2018-05-24 ENCOUNTER — Other Ambulatory Visit: Payer: Self-pay

## 2018-05-24 ENCOUNTER — Ambulatory Visit (HOSPITAL_COMMUNITY)
Admission: RE | Admit: 2018-05-24 | Discharge: 2018-05-24 | Disposition: A | Discharge status: Home / Self Care | Payer: Medicare Other | Source: Ambulatory Visit | Attending: Thoracic Surgery (Cardiothoracic Vascular Surgery) | Admitting: Thoracic Surgery (Cardiothoracic Vascular Surgery)

## 2018-05-24 DIAGNOSIS — J90 Pleural effusion, not elsewhere classified: Secondary | ICD-10-CM

## 2018-05-24 DIAGNOSIS — Z9889 Other specified postprocedural states: Secondary | ICD-10-CM

## 2018-05-24 HISTORY — PX: IR THORACENTESIS ASP PLEURAL SPACE W/IMG GUIDE: IMG5380

## 2018-05-24 MED ORDER — LIDOCAINE HCL 1 % IJ SOLN
INTRAMUSCULAR | Status: DC | PRN
  Administered 2018-05-24: 10 mL

## 2018-05-24 MED ORDER — LIDOCAINE HCL 1 % IJ SOLN
INTRAMUSCULAR | Status: AC
  Filled 2018-05-24: qty 20

## 2018-05-24 NOTE — Pre-Procedure Instructions (Signed)
Boubacar Lerette Schwenke  05/24/2018    Your procedure is scheduled on Friday, May 27, 2018 at 7:30 AM.   Report to [facility] Entrance "A" Admitting Office at 5:30 AM.   Call this number if you have problems the morning of surgery: [phone]   Questions prior to day of surgery, please call [phone] between 8 & 4 PM.   Remember:  Do not eat or drink after midnight Thursday, 05/26/18.  Take these medicines the morning of surgery with A SIP OF WATER: Amlodipine (Norvasc)  Stop NSAIDS (Ibuprofen, Aleve, etc) and Vitamins as of today. Do not use Aspirin products (Goody's, BC Powders, etc) or Herbal medications prior to surgery.    Do not wear jewelry.  Do not wear lotions, powders, cologne or deodorant.  Men may shave face and neck.   Do not bring valuables to the hospital.  [facility] is not responsible for any belongings or valuables.  Contacts, dentures or bridgework may not be worn into surgery.  Leave your suitcase in the car.  After surgery it may be brought to your room.  For patients admitted to the hospital, discharge time will be determined by your treatment team.  [facility] - Preparing for Surgery  Before surgery, you can play an important role.  Because skin is not sterile, your skin needs to be as free of germs as possible.  You can reduce the number of germs on you skin by washing with CHG (chlorahexidine gluconate) soap before surgery.  CHG is an antiseptic cleaner which kills germs and bonds with the skin to continue killing germs even after washing.  Oral Hygiene is also important in reducing the risk of infection.  Remember to brush your teeth with your regular toothpaste the morning of surgery.  Please DO NOT use if you have an allergy to CHG or antibacterial soaps.  If your skin becomes reddened/irritated stop using the CHG and inform your nurse when you arrive at Short Stay.  Do not shave (including legs and underarms) for at least 48 hours prior to  the first CHG shower.  You may shave your face.  Please follow these instructions carefully:   1.  Shower with CHG Soap the night before surgery and the morning of Surgery.  2.  If you choose to wash your hair, wash your hair first as usual with your normal shampoo.  3.  After you shampoo, rinse your hair and body thoroughly to remove the shampoo. 4.  Use CHG as you would any other liquid soap.  You can apply chg directly to the skin and wash gently with a      scrungie or washcloth.           5.  Apply the CHG Soap to your body ONLY FROM THE NECK DOWN.   Do not use on open wounds or open sores. Avoid contact with your eyes, ears, mouth and genitals (private parts).  Wash genitals (private parts) with your normal soap.  6.  Wash thoroughly, paying special attention to the area where your surgery will be performed.  7.  Thoroughly rinse your body with warm water from the neck down.  8.  DO NOT shower/wash with your normal soap after using and rinsing off the CHG Soap.  9.  Pat yourself dry with a clean towel.            10.  Wear clean pajamas.  11.  Place clean sheets on your bed the night of your first shower and do not sleep with pets.  Day of Surgery  Shower as above. Do not apply any lotions/deodorants the morning of surgery.   Please wear clean clothes to the hospital. Remember to brush your teeth with toothpaste.   Please read over the fact sheets that you were given.

## 2018-05-24 NOTE — Procedures (Signed)
PROCEDURE SUMMARY:  Successful image-guided left thoracentesis. Yielded 1.0 liters of clear, brown fluid. Procedure was aborted with residual fluid remaining due to patient's complaints of left shoulder pain and transient dizziness. Patient noted to be hypotensive at 86/68 mmHg - he was laid supine and given water with improvement in symptoms and BP. EBL: Zero No immediate complications.  Specimen was not sent for labs. Post procedure CXR shows no pneumothorax.  Please see imaging section of Epic for full dictation.  Joaquim Nam PA-C 05/24/2018 10:53 AM

## 2018-05-25 ENCOUNTER — Other Ambulatory Visit: Payer: Self-pay

## 2018-05-25 ENCOUNTER — Encounter (HOSPITAL_COMMUNITY): Payer: Self-pay

## 2018-05-25 ENCOUNTER — Encounter (HOSPITAL_COMMUNITY)
Admission: RE | Admit: 2018-05-25 | Discharge: 2018-05-25 | Disposition: A | Discharge status: Home / Self Care | Payer: Medicare Other | Source: Ambulatory Visit | Attending: Thoracic Surgery (Cardiothoracic Vascular Surgery) | Admitting: Thoracic Surgery (Cardiothoracic Vascular Surgery)

## 2018-05-25 ENCOUNTER — Ambulatory Visit (HOSPITAL_COMMUNITY)
Admission: RE | Admit: 2018-05-25 | Discharge: 2018-05-25 | Disposition: A | Discharge status: Home / Self Care | Payer: Medicare Other | Source: Ambulatory Visit | Attending: Thoracic Surgery (Cardiothoracic Vascular Surgery) | Admitting: Thoracic Surgery (Cardiothoracic Vascular Surgery)

## 2018-05-25 DIAGNOSIS — J90 Pleural effusion, not elsewhere classified: Secondary | ICD-10-CM | POA: Insufficient documentation

## 2018-05-25 HISTORY — DX: Personal history of urinary calculi: Z87.442

## 2018-05-25 HISTORY — DX: Pleural effusion, not elsewhere classified: J90

## 2018-05-25 LAB — URINALYSIS, ROUTINE W REFLEX MICROSCOPIC
Bacteria, UA: NONE SEEN
Bilirubin Urine: NEGATIVE
GLUCOSE, UA: NEGATIVE mg/dL
Hgb urine dipstick: NEGATIVE
Ketones, ur: 5 mg/dL — AB
LEUKOCYTE UA: NEGATIVE
NITRITE: NEGATIVE
Protein, ur: 30 mg/dL — AB
Specific Gravity, Urine: 1.031 — ABNORMAL HIGH (ref 1.005–1.030)
pH: 5 (ref 5.0–8.0)

## 2018-05-25 LAB — BLOOD GAS, ARTERIAL
Acid-Base Excess: 0.4 mmol/L (ref 0.0–2.0)
Bicarbonate: 24.3 mmol/L (ref 20.0–28.0)
Drawn by: 421801
FIO2: 0.21
O2 Saturation: 99 %
PH ART: 7.422 (ref 7.350–7.450)
Patient temperature: 98.6
pCO2 arterial: 37.9 mmHg (ref 32.0–48.0)
pO2, Arterial: 112 mmHg — ABNORMAL HIGH (ref 83.0–108.0)

## 2018-05-25 LAB — PROTIME-INR
INR: 1.1 (ref 0.8–1.2)
Prothrombin Time: 13.7 seconds (ref 11.4–15.2)

## 2018-05-25 LAB — CBC
HCT: 33.8 % — ABNORMAL LOW (ref 39.0–52.0)
Hemoglobin: 11.1 g/dL — ABNORMAL LOW (ref 13.0–17.0)
MCH: 37.5 pg — ABNORMAL HIGH (ref 26.0–34.0)
MCHC: 32.8 g/dL (ref 30.0–36.0)
MCV: 114.2 fL — ABNORMAL HIGH (ref 80.0–100.0)
NRBC: 0 % (ref 0.0–0.2)
Platelets: 423 10*3/uL — ABNORMAL HIGH (ref 150–400)
RBC: 2.96 MIL/uL — AB (ref 4.22–5.81)
RDW: 14 % (ref 11.5–15.5)
WBC: 5.3 10*3/uL (ref 4.0–10.5)

## 2018-05-25 LAB — COMPREHENSIVE METABOLIC PANEL
ALT: 8 U/L (ref 0–44)
AST: 11 U/L — ABNORMAL LOW (ref 15–41)
Albumin: 3.4 g/dL — ABNORMAL LOW (ref 3.5–5.0)
Alkaline Phosphatase: 85 U/L (ref 38–126)
Anion gap: 8 (ref 5–15)
BUN: 14 mg/dL (ref 8–23)
CO2: 22 mmol/L (ref 22–32)
Calcium: 9.1 mg/dL (ref 8.9–10.3)
Chloride: 107 mmol/L (ref 98–111)
Creatinine, Ser: 0.96 mg/dL (ref 0.61–1.24)
GFR calc Af Amer: >60 mL/min (ref >60)
GFR calc non Af Amer: >60 mL/min (ref >60)
Glucose, Bld: 115 mg/dL — ABNORMAL HIGH (ref 70–99)
Potassium: 3.6 mmol/L (ref 3.5–5.1)
Sodium: 137 mmol/L (ref 135–145)
Total Bilirubin: 0.6 mg/dL (ref 0.3–1.2)
Total Protein: 6.9 g/dL (ref 6.5–8.1)

## 2018-05-25 LAB — TYPE AND SCREEN
ABO/RH(D): A POS
Antibody Screen: NEGATIVE

## 2018-05-25 LAB — APTT: aPTT: 33 seconds (ref 24–36)

## 2018-05-25 LAB — SURGICAL PCR SCREEN
MRSA, PCR: NEGATIVE
Staphylococcus aureus: NEGATIVE

## 2018-05-25 LAB — ABO/RH: ABO/RH(D): A POS

## 2018-05-25 NOTE — Progress Notes (Addendum)
Has the patient experienced the following:  Cough - pt denies Fever (>100.13F) - pt denies Runny nose -pt denies Sore throat - pt deneis Difficulty breathing/shortness of breath - pt denies Have you or a family member traveled in the last 14 days and where? - no   Patient has memory issues so his wife will have to be with him at his appointment to help answer questions-she has not traveled or had any symptoms herself

## 2018-05-25 NOTE — Progress Notes (Addendum)
PCP - Hulan Fess, MD Cardiologist -Buford Dresser, MD  Chest x-ray - 05/25/2018  EKG - 05/25/2018  Stress Test - pt denies past 5-10 year ECHO - 01/2018 in EPIC  Cardiac Cath - pt denies  Sleep Study - no CPAP - n/a  Fasting Blood Sugar - n/a Checks Blood Sugar _____ times a day-n/a  Blood Thinner Instructions: n/a Aspirin Instructions: n/a  Anesthesia review: Yes, heart history  Patient denies shortness of breath, fever, cough and chest pain at PAT appointment  Patient verbalized understanding of instructions that were given to them at the PAT appointment. Patient was also instructed that they will need to review over the PAT instructions again at home before surgery.

## 2018-05-25 NOTE — Progress Notes (Addendum)
Patient was able to urinate, new order entered for Urinalysis

## 2018-05-26 NOTE — Anesthesia Preprocedure Evaluation (Addendum)
Anesthesia Evaluation  Patient identified by MRN, date of birth, ID band Patient awake    Reviewed: Allergy & Precautions, NPO status , Patient's Chart, lab work & pertinent test results  Airway Mallampati: II  TM Distance: >3 FB Neck ROM: Full    Dental  (+) Dental Advisory Given, Missing   Pulmonary neg pulmonary ROS, former smoker,    Pulmonary exam normal breath sounds clear to auscultation       Cardiovascular hypertension, Pt. on medications Normal cardiovascular exam Rhythm:Regular Rate:Normal     Neuro/Psych PSYCHIATRIC DISORDERS Dementia negative neurological ROS     GI/Hepatic negative GI ROS, Neg liver ROS,   Endo/Other  negative endocrine ROS  Renal/GU Renal disease     Musculoskeletal negative musculoskeletal ROS (+)   Abdominal   Peds negative pediatric ROS (+)  Hematology negative hematology ROS (+)   Anesthesia Other Findings   Reproductive/Obstetrics                           Anesthesia Physical Anesthesia Plan  ASA: III  Anesthesia Plan: General   Post-op Pain Management:    Induction: Intravenous  PONV Risk Score and Plan: 3 and Ondansetron, Dexamethasone and Treatment may vary due to age or medical condition  Airway Management Planned: Double Lumen EBT  Additional Equipment: Arterial line  Intra-op Plan:   Post-operative Plan: Extubation in OR  Informed Consent: I have reviewed the patients History and Physical, chart, labs and discussed the procedure including the risks, benefits and alternatives for the proposed anesthesia with the patient or authorized representative who has indicated his/her understanding and acceptance.     Dental advisory given  Plan Discussed with: CRNA  Anesthesia Plan Comments: (2xPIV     Recently evaluated by cardiology for episode of syncope that occurred 12/2017. He wore an event monitor 01/2018 that was benign, per  Dr. Judeth Cornfield comment "Monitor overall looks very good. No major rhythm issues. The one patient triggered event was normal sinus rhythm. The monitor showed rare early beats and two very brief funs of SVT, but nothing dangerous. There was nothing on the monitor that suggests an electrical system reason [place]." He also had an echo 01/2018 that showed EF 55-60%, no significant valvular abnormalities. )       Anesthesia Quick Evaluation

## 2018-05-27 ENCOUNTER — Inpatient Hospital Stay (HOSPITAL_COMMUNITY): Payer: Medicare Other | Admitting: Physician Assistant

## 2018-05-27 ENCOUNTER — Inpatient Hospital Stay (HOSPITAL_COMMUNITY)
Admission: RE | Admit: 2018-05-27 | Discharge: 2018-05-30 | DRG: 829 | Disposition: A | Discharge status: Home / Self Care | Payer: Medicare Other | Attending: Thoracic Surgery (Cardiothoracic Vascular Surgery) | Admitting: Thoracic Surgery (Cardiothoracic Vascular Surgery)

## 2018-05-27 ENCOUNTER — Inpatient Hospital Stay (HOSPITAL_COMMUNITY): Payer: Medicare Other

## 2018-05-27 ENCOUNTER — Encounter (HOSPITAL_COMMUNITY)
Admission: RE | Disposition: A | Discharge status: Home / Self Care | Payer: Self-pay | Source: Home / Self Care | Attending: Thoracic Surgery (Cardiothoracic Vascular Surgery)

## 2018-05-27 ENCOUNTER — Inpatient Hospital Stay (HOSPITAL_COMMUNITY): Payer: Medicare Other | Admitting: Anesthesiology

## 2018-05-27 ENCOUNTER — Other Ambulatory Visit: Payer: Self-pay

## 2018-05-27 ENCOUNTER — Encounter (HOSPITAL_COMMUNITY): Payer: Self-pay | Admitting: Certified Registered"

## 2018-05-27 DIAGNOSIS — J90 Pleural effusion, not elsewhere classified: Secondary | ICD-10-CM

## 2018-05-27 DIAGNOSIS — I700 Atherosclerosis of aorta: Secondary | ICD-10-CM | POA: Diagnosis present

## 2018-05-27 DIAGNOSIS — C457 Mesothelioma of other sites: Secondary | ICD-10-CM | POA: Diagnosis present

## 2018-05-27 DIAGNOSIS — R0602 Shortness of breath: Secondary | ICD-10-CM | POA: Diagnosis present

## 2018-05-27 DIAGNOSIS — Z87891 Personal history of nicotine dependence: Secondary | ICD-10-CM | POA: Diagnosis not present

## 2018-05-27 DIAGNOSIS — I2510 Atherosclerotic heart disease of native coronary artery without angina pectoris: Secondary | ICD-10-CM | POA: Diagnosis present

## 2018-05-27 DIAGNOSIS — Z7982 Long term (current) use of aspirin: Secondary | ICD-10-CM

## 2018-05-27 DIAGNOSIS — F0390 Unspecified dementia without behavioral disturbance: Secondary | ICD-10-CM | POA: Diagnosis present

## 2018-05-27 DIAGNOSIS — D62 Acute posthemorrhagic anemia: Secondary | ICD-10-CM | POA: Diagnosis present

## 2018-05-27 DIAGNOSIS — J910 Malignant pleural effusion: Secondary | ICD-10-CM | POA: Diagnosis present

## 2018-05-27 DIAGNOSIS — I10 Essential (primary) hypertension: Secondary | ICD-10-CM | POA: Diagnosis present

## 2018-05-27 DIAGNOSIS — D471 Chronic myeloproliferative disease: Secondary | ICD-10-CM | POA: Diagnosis present

## 2018-05-27 DIAGNOSIS — Z09 Encounter for follow-up examination after completed treatment for conditions other than malignant neoplasm: Secondary | ICD-10-CM

## 2018-05-27 DIAGNOSIS — Z9889 Other specified postprocedural states: Secondary | ICD-10-CM

## 2018-05-27 DIAGNOSIS — Z79899 Other long term (current) drug therapy: Secondary | ICD-10-CM | POA: Diagnosis not present

## 2018-05-27 HISTORY — PX: DECORTICATION: SHX5101

## 2018-05-27 HISTORY — PX: VIDEO ASSISTED THORACOSCOPY: SHX5073

## 2018-05-27 HISTORY — PX: PLEURAL EFFUSION DRAINAGE: SHX5099

## 2018-05-27 LAB — BODY FLUID CELL COUNT WITH DIFFERENTIAL
Lymphs, Fluid: 44 %
Monocyte-Macrophage-Serous Fluid: 3 % — ABNORMAL LOW (ref 50–90)
NEUTROPHIL FLUID: 53 % — AB (ref 0–25)
Total Nucleated Cell Count, Fluid: 32 cu mm (ref 0–1000)

## 2018-05-27 LAB — ACID FAST SMEAR (AFB, MYCOBACTERIA): Acid Fast Smear: NEGATIVE

## 2018-05-27 LAB — GRAM STAIN

## 2018-05-27 SURGERY — VIDEO ASSISTED THORACOSCOPY
Anesthesia: General | Site: Chest | Laterality: Left

## 2018-05-27 MED ORDER — LACTATED RINGERS IV SOLN
INTRAVENOUS | Status: DC | PRN
  Administered 2018-05-27 (×2): via INTRAVENOUS

## 2018-05-27 MED ORDER — LOSARTAN POTASSIUM 50 MG PO TABS
100.0000 mg | ORAL_TABLET | Freq: Every day | ORAL | Status: DC
  Administered 2018-05-28 – 2018-05-30 (×3): 100 mg via ORAL
  Filled 2018-05-27 (×3): qty 2

## 2018-05-27 MED ORDER — FENTANYL CITRATE (PF) 100 MCG/2ML IJ SOLN
25.0000 ug | INTRAMUSCULAR | Status: DC | PRN
  Administered 2018-05-27: 25 ug via INTRAVENOUS
  Administered 2018-05-27 (×2): 50 ug via INTRAVENOUS

## 2018-05-27 MED ORDER — LUNG SURGERY BOOK
Freq: Once | Status: AC
  Administered 2018-05-27: 18:00:00

## 2018-05-27 MED ORDER — POTASSIUM CHLORIDE 10 MEQ/50ML IV SOLN
10.0000 meq | Freq: Every day | INTRAVENOUS | Status: DC | PRN
  Filled 2018-05-27: qty 50

## 2018-05-27 MED ORDER — ONDANSETRON HCL 4 MG/2ML IJ SOLN
INTRAMUSCULAR | Status: AC
  Filled 2018-05-27: qty 2

## 2018-05-27 MED ORDER — PHENYLEPHRINE 40 MCG/ML (10ML) SYRINGE FOR IV PUSH (FOR BLOOD PRESSURE SUPPORT)
PREFILLED_SYRINGE | INTRAVENOUS | Status: DC | PRN
  Administered 2018-05-27: 80 ug via INTRAVENOUS
  Administered 2018-05-27: 120 ug via INTRAVENOUS
  Administered 2018-05-27: 40 ug via INTRAVENOUS
  Administered 2018-05-27: 120 ug via INTRAVENOUS
  Administered 2018-05-27 (×2): 80 ug via INTRAVENOUS

## 2018-05-27 MED ORDER — ACETAMINOPHEN 160 MG/5ML PO SOLN: 1000.0000 mg | Freq: Four times a day (QID) | ORAL | Status: DC

## 2018-05-27 MED ORDER — SUGAMMADEX SODIUM 200 MG/2ML IV SOLN
INTRAVENOUS | Status: DC | PRN
  Administered 2018-05-27: 200 mg via INTRAVENOUS

## 2018-05-27 MED ORDER — BUPIVACAINE HCL (PF) 0.5 % IJ SOLN
INTRAMUSCULAR | Status: AC
  Filled 2018-05-27: qty 30

## 2018-05-27 MED ORDER — PROMETHAZINE HCL 25 MG/ML IJ SOLN
INTRAMUSCULAR | Status: AC
  Filled 2018-05-27: qty 1

## 2018-05-27 MED ORDER — DEXTROSE-NACL 5-0.9 % IV SOLN
INTRAVENOUS | Status: DC
  Administered 2018-05-27: 18:00:00 via INTRAVENOUS
  Administered 2018-05-28: 1000 mL via INTRAVENOUS

## 2018-05-27 MED ORDER — ONDANSETRON HCL 4 MG/2ML IJ SOLN
4.0000 mg | Freq: Four times a day (QID) | INTRAMUSCULAR | Status: DC | PRN
  Administered 2018-05-29 (×3): 4 mg via INTRAVENOUS
  Filled 2018-05-27 (×3): qty 2

## 2018-05-27 MED ORDER — FENTANYL CITRATE (PF) 100 MCG/2ML IJ SOLN
INTRAMUSCULAR | Status: AC
  Filled 2018-05-27: qty 2

## 2018-05-27 MED ORDER — NALOXONE HCL 0.4 MG/ML IJ SOLN: 0.4000 mg | INTRAMUSCULAR | Status: DC | PRN

## 2018-05-27 MED ORDER — ACETAMINOPHEN 500 MG PO TABS
1000.0000 mg | ORAL_TABLET | Freq: Four times a day (QID) | ORAL | Status: DC
  Administered 2018-05-27 – 2018-05-30 (×12): 1000 mg via ORAL
  Filled 2018-05-27 (×12): qty 2

## 2018-05-27 MED ORDER — LACTATED RINGERS IV SOLN
INTRAVENOUS | Status: DC | PRN
  Administered 2018-05-27 (×2): via INTRAVENOUS

## 2018-05-27 MED ORDER — GLYCOPYRROLATE PF 0.2 MG/ML IJ SOSY
PREFILLED_SYRINGE | INTRAMUSCULAR | Status: DC | PRN
  Administered 2018-05-27: .1 mg via INTRAVENOUS

## 2018-05-27 MED ORDER — LIDOCAINE 2% (20 MG/ML) 5 ML SYRINGE
INTRAMUSCULAR | Status: AC
  Filled 2018-05-27: qty 5

## 2018-05-27 MED ORDER — ROCURONIUM BROMIDE 50 MG/5ML IV SOSY
PREFILLED_SYRINGE | INTRAVENOUS | Status: DC | PRN
  Administered 2018-05-27: 50 mg via INTRAVENOUS
  Administered 2018-05-27: 20 mg via INTRAVENOUS

## 2018-05-27 MED ORDER — GLYCOPYRROLATE PF 0.2 MG/ML IJ SOSY
PREFILLED_SYRINGE | INTRAMUSCULAR | Status: AC
  Filled 2018-05-27: qty 1

## 2018-05-27 MED ORDER — TRAMADOL HCL 50 MG PO TABS
50.0000 mg | ORAL_TABLET | Freq: Four times a day (QID) | ORAL | Status: DC | PRN
  Administered 2018-05-28 – 2018-05-29 (×3): 100 mg via ORAL
  Filled 2018-05-27 (×3): qty 2

## 2018-05-27 MED ORDER — ROCURONIUM BROMIDE 50 MG/5ML IV SOSY
PREFILLED_SYRINGE | INTRAVENOUS | Status: AC
  Filled 2018-05-27: qty 5

## 2018-05-27 MED ORDER — BUPIVACAINE HCL (PF) 0.5 % IJ SOLN
INTRAMUSCULAR | Status: DC | PRN
  Administered 2018-05-27: 30 mL

## 2018-05-27 MED ORDER — DEXAMETHASONE SODIUM PHOSPHATE 10 MG/ML IJ SOLN
INTRAMUSCULAR | Status: AC
  Filled 2018-05-27: qty 1

## 2018-05-27 MED ORDER — MORPHINE SULFATE 2 MG/ML IV SOLN
INTRAVENOUS | Status: DC
  Administered 2018-05-27: 13:00:00 via INTRAVENOUS
  Administered 2018-05-27: 3 mg via INTRAVENOUS
  Administered 2018-05-27: 7 mg via INTRAVENOUS
  Administered 2018-05-28: 3 mg via INTRAVENOUS
  Filled 2018-05-27: qty 50

## 2018-05-27 MED ORDER — CEFAZOLIN SODIUM-DEXTROSE 2-4 GM/100ML-% IV SOLN
2.0000 g | Freq: Three times a day (TID) | INTRAVENOUS | Status: AC
  Administered 2018-05-27 – 2018-05-28 (×2): 2 g via INTRAVENOUS
  Filled 2018-05-27 (×2): qty 100

## 2018-05-27 MED ORDER — PROPOFOL 10 MG/ML IV BOLUS
INTRAVENOUS | Status: DC | PRN
  Administered 2018-05-27: 140 mg via INTRAVENOUS

## 2018-05-27 MED ORDER — METOCLOPRAMIDE HCL 5 MG/ML IJ SOLN
10.0000 mg | Freq: Four times a day (QID) | INTRAMUSCULAR | Status: AC
  Administered 2018-05-27 – 2018-05-28 (×4): 10 mg via INTRAVENOUS
  Filled 2018-05-27 (×4): qty 2

## 2018-05-27 MED ORDER — SODIUM CHLORIDE (PF) 0.9 % IJ SOLN
INTRAMUSCULAR | Status: DC | PRN
  Administered 2018-05-27: 50 mL via INTRAVENOUS

## 2018-05-27 MED ORDER — FENTANYL CITRATE (PF) 100 MCG/2ML IJ SOLN
INTRAMUSCULAR | Status: DC | PRN
  Administered 2018-05-27 (×4): 50 ug via INTRAVENOUS
  Administered 2018-05-27: 150 ug via INTRAVENOUS
  Administered 2018-05-27: 50 ug via INTRAVENOUS

## 2018-05-27 MED ORDER — DIPHENHYDRAMINE HCL 12.5 MG/5ML PO ELIX
12.5000 mg | ORAL_SOLUTION | Freq: Four times a day (QID) | ORAL | Status: DC | PRN
  Filled 2018-05-27: qty 5

## 2018-05-27 MED ORDER — ONDANSETRON HCL 4 MG/2ML IJ SOLN
INTRAMUSCULAR | Status: DC | PRN
  Administered 2018-05-27: 4 mg via INTRAVENOUS

## 2018-05-27 MED ORDER — BISACODYL 5 MG PO TBEC
10.0000 mg | DELAYED_RELEASE_TABLET | Freq: Every day | ORAL | Status: DC
  Administered 2018-05-28 – 2018-05-30 (×3): 10 mg via ORAL
  Filled 2018-05-27 (×3): qty 2

## 2018-05-27 MED ORDER — SODIUM CHLORIDE 0.9% FLUSH: 9.0000 mL | INTRAVENOUS | Status: DC | PRN

## 2018-05-27 MED ORDER — DIPHENHYDRAMINE HCL 50 MG/ML IJ SOLN: 12.5000 mg | Freq: Four times a day (QID) | INTRAMUSCULAR | Status: DC | PRN

## 2018-05-27 MED ORDER — ENOXAPARIN SODIUM 40 MG/0.4ML ~~LOC~~ SOLN
40.0000 mg | SUBCUTANEOUS | Status: DC
  Administered 2018-05-27 – 2018-05-29 (×3): 40 mg via SUBCUTANEOUS
  Filled 2018-05-27 (×3): qty 0.4

## 2018-05-27 MED ORDER — 0.9 % SODIUM CHLORIDE (POUR BTL) OPTIME
TOPICAL | Status: DC | PRN
  Administered 2018-05-27: 2000 mL

## 2018-05-27 MED ORDER — CEFAZOLIN SODIUM-DEXTROSE 2-4 GM/100ML-% IV SOLN
2.0000 g | INTRAVENOUS | Status: AC
  Administered 2018-05-27: 2 g via INTRAVENOUS

## 2018-05-27 MED ORDER — BUPIVACAINE LIPOSOME 1.3 % IJ SUSP
20.0000 mL | INTRAMUSCULAR | Status: AC
  Administered 2018-05-27: 20 mL
  Filled 2018-05-27: qty 20

## 2018-05-27 MED ORDER — FENTANYL CITRATE (PF) 250 MCG/5ML IJ SOLN
INTRAMUSCULAR | Status: AC
  Filled 2018-05-27: qty 5

## 2018-05-27 MED ORDER — LEVALBUTEROL HCL 0.63 MG/3ML IN NEBU
0.6300 mg | INHALATION_SOLUTION | Freq: Four times a day (QID) | RESPIRATORY_TRACT | Status: DC
  Administered 2018-05-27 – 2018-05-28 (×5): 0.63 mg via RESPIRATORY_TRACT
  Filled 2018-05-27 (×5): qty 3

## 2018-05-27 MED ORDER — PROPOFOL 10 MG/ML IV BOLUS
INTRAVENOUS | Status: AC
  Filled 2018-05-27: qty 20

## 2018-05-27 MED ORDER — HYDROXYUREA 500 MG PO CAPS
1000.0000 mg | ORAL_CAPSULE | Freq: Every day | ORAL | Status: DC
  Administered 2018-05-28 – 2018-05-30 (×3): 1000 mg via ORAL
  Filled 2018-05-27 (×3): qty 2

## 2018-05-27 MED ORDER — CEFAZOLIN SODIUM-DEXTROSE 2-4 GM/100ML-% IV SOLN
INTRAVENOUS | Status: AC
  Filled 2018-05-27: qty 100

## 2018-05-27 MED ORDER — EPHEDRINE SULFATE-NACL 50-0.9 MG/10ML-% IV SOSY
PREFILLED_SYRINGE | INTRAVENOUS | Status: DC | PRN
  Administered 2018-05-27: 10 mg via INTRAVENOUS

## 2018-05-27 MED ORDER — SODIUM CHLORIDE 0.9 % IV SOLN
INTRAVENOUS | Status: DC | PRN
  Administered 2018-05-27: 60 ug/min via INTRAVENOUS

## 2018-05-27 MED ORDER — ONDANSETRON HCL 4 MG/2ML IJ SOLN: 4.0000 mg | Freq: Four times a day (QID) | INTRAMUSCULAR | Status: DC | PRN

## 2018-05-27 MED ORDER — OXYCODONE HCL 5 MG PO TABS
5.0000 mg | ORAL_TABLET | ORAL | Status: DC | PRN
  Administered 2018-05-28: 5 mg via ORAL
  Administered 2018-05-29: 10 mg via ORAL
  Administered 2018-05-29 – 2018-05-30 (×3): 5 mg via ORAL
  Filled 2018-05-27 (×3): qty 1
  Filled 2018-05-27 (×2): qty 2
  Filled 2018-05-27: qty 1

## 2018-05-27 MED ORDER — PHENYLEPHRINE 40 MCG/ML (10ML) SYRINGE FOR IV PUSH (FOR BLOOD PRESSURE SUPPORT)
PREFILLED_SYRINGE | INTRAVENOUS | Status: AC
  Filled 2018-05-27: qty 10

## 2018-05-27 MED ORDER — LIDOCAINE 2% (20 MG/ML) 5 ML SYRINGE
INTRAMUSCULAR | Status: DC | PRN
  Administered 2018-05-27: 100 mg via INTRAVENOUS

## 2018-05-27 MED ORDER — MEPERIDINE HCL 50 MG/ML IJ SOLN: 6.2500 mg | INTRAMUSCULAR | Status: DC | PRN

## 2018-05-27 MED ORDER — SENNOSIDES-DOCUSATE SODIUM 8.6-50 MG PO TABS
1.0000 | ORAL_TABLET | Freq: Every day | ORAL | Status: DC
  Administered 2018-05-27 – 2018-05-29 (×3): 1 via ORAL
  Filled 2018-05-27 (×3): qty 1

## 2018-05-27 MED ORDER — PHENYLEPHRINE HCL 10 MG/ML IJ SOLN
INTRAMUSCULAR | Status: DC | PRN
  Administered 2018-05-27 (×2): 40 ug via INTRAVENOUS

## 2018-05-27 MED ORDER — AMLODIPINE BESYLATE 5 MG PO TABS
5.0000 mg | ORAL_TABLET | Freq: Every day | ORAL | Status: DC
  Administered 2018-05-28 – 2018-05-30 (×3): 5 mg via ORAL
  Filled 2018-05-27 (×3): qty 1

## 2018-05-27 MED ORDER — PROMETHAZINE HCL 25 MG/ML IJ SOLN
6.2500 mg | INTRAMUSCULAR | Status: AC | PRN
  Administered 2018-05-27 (×2): 6.25 mg via INTRAVENOUS

## 2018-05-27 MED ORDER — DEXAMETHASONE SODIUM PHOSPHATE 10 MG/ML IJ SOLN
INTRAMUSCULAR | Status: DC | PRN
  Administered 2018-05-27: 8 mg via INTRAVENOUS

## 2018-05-27 SURGICAL SUPPLY — 80 items
ADH SKN CLS APL DERMABOND .7 (GAUZE/BANDAGES/DRESSINGS)
APL SWBSTK 6 STRL LF DISP (MISCELLANEOUS)
APPLICATOR COTTON TIP 6 STRL (MISCELLANEOUS) IMPLANT
APPLICATOR COTTON TIP 6IN STRL (MISCELLANEOUS)
APPLIER CLIP ROT 10 11.4 M/L (STAPLE)
APR CLP MED LRG 11.4X10 (STAPLE)
BAG SPEC RTRVL LRG 6X4 10 (ENDOMECHANICALS)
CANISTER SUCT 3000ML PPV (MISCELLANEOUS) ×4 IMPLANT
CATH THORACIC 28FR RT ANG (CATHETERS) IMPLANT
CATH THORACIC 36FR (CATHETERS) IMPLANT
CATH THORACIC 36FR RT ANG (CATHETERS) IMPLANT
CLIP APPLIE ROT 10 11.4 M/L (STAPLE) IMPLANT
CLIP VESOCCLUDE MED 6/CT (CLIP) IMPLANT
CONN ST 1/4X3/8  BEN (MISCELLANEOUS) ×2
CONN ST 1/4X3/8 BEN (MISCELLANEOUS) IMPLANT
CONN Y 3/8X3/8X3/8  BEN (MISCELLANEOUS) ×2
CONN Y 3/8X3/8X3/8 BEN (MISCELLANEOUS) ×2 IMPLANT
CONT SPEC 4OZ CLIKSEAL STRL BL (MISCELLANEOUS) ×16 IMPLANT
COVER SURGICAL LIGHT HANDLE (MISCELLANEOUS) ×4 IMPLANT
COVER WAND RF STERILE (DRAPES) ×4 IMPLANT
DERMABOND ADVANCED (GAUZE/BANDAGES/DRESSINGS)
DERMABOND ADVANCED .7 DNX12 (GAUZE/BANDAGES/DRESSINGS) IMPLANT
DRAIN CHANNEL 28F RND 3/8 FF (WOUND CARE) IMPLANT
DRAIN CHANNEL 32F RND 10.7 FF (WOUND CARE) IMPLANT
DRAPE LAPAROSCOPIC ABDOMINAL (DRAPES) ×4 IMPLANT
DRAPE SLUSH/WARMER DISC (DRAPES) ×4 IMPLANT
ELECT REM PT RETURN 9FT ADLT (ELECTROSURGICAL) ×4
ELECTRODE REM PT RTRN 9FT ADLT (ELECTROSURGICAL) ×2 IMPLANT
GAUZE SPONGE 4X4 12PLY STRL (GAUZE/BANDAGES/DRESSINGS) ×4 IMPLANT
GLOVE SURG SIGNA 7.5 PF LTX (GLOVE) ×8 IMPLANT
GOWN STRL REUS W/ TWL LRG LVL3 (GOWN DISPOSABLE) ×4 IMPLANT
GOWN STRL REUS W/ TWL XL LVL3 (GOWN DISPOSABLE) ×2 IMPLANT
GOWN STRL REUS W/TWL LRG LVL3 (GOWN DISPOSABLE) ×8
GOWN STRL REUS W/TWL XL LVL3 (GOWN DISPOSABLE) ×4
HEMOSTAT SURGICEL 2X14 (HEMOSTASIS) IMPLANT
IV CATH 22GX1 FEP (IV SOLUTION) IMPLANT
KIT BASIN OR (CUSTOM PROCEDURE TRAY) ×4 IMPLANT
KIT PLEURX DRAIN CATH 15.5FR (DRAIN) ×2 IMPLANT
KIT SUCTION CATH 14FR (SUCTIONS) ×4 IMPLANT
KIT TURNOVER KIT B (KITS) ×4 IMPLANT
NDL SPNL 18GX3.5 QUINCKE PK (NEEDLE) IMPLANT
NEEDLE SPNL 18GX3.5 QUINCKE PK (NEEDLE) ×4 IMPLANT
NS IRRIG 1000ML POUR BTL (IV SOLUTION) ×8 IMPLANT
PACK CHEST (CUSTOM PROCEDURE TRAY) ×4 IMPLANT
PAD ARMBOARD 7.5X6 YLW CONV (MISCELLANEOUS) ×8 IMPLANT
POUCH ENDO CATCH II 15MM (MISCELLANEOUS) IMPLANT
POUCH SPECIMEN RETRIEVAL 10MM (ENDOMECHANICALS) IMPLANT
SEALANT PROGEL (MISCELLANEOUS) IMPLANT
SEALANT SURG COSEAL 4ML (VASCULAR PRODUCTS) IMPLANT
SEALANT SURG COSEAL 8ML (VASCULAR PRODUCTS) IMPLANT
SOLUTION ANTI FOG 6CC (MISCELLANEOUS) ×4 IMPLANT
SPECIMEN JAR MEDIUM (MISCELLANEOUS) ×4 IMPLANT
SPONGE INTESTINAL PEANUT (DISPOSABLE) IMPLANT
SPONGE TONSIL TAPE 1 RFD (DISPOSABLE) ×4 IMPLANT
SUT ETHILON 3 0 PS 1 (SUTURE) ×2 IMPLANT
SUT PROLENE 4 0 RB 1 (SUTURE)
SUT PROLENE 4-0 RB1 .5 CRCL 36 (SUTURE) IMPLANT
SUT SILK  1 MH (SUTURE) ×6
SUT SILK 1 MH (SUTURE) ×4 IMPLANT
SUT SILK 2 0SH CR/8 30 (SUTURE) IMPLANT
SUT SILK 3 0SH CR/8 30 (SUTURE) IMPLANT
SUT VIC AB 1 CTX 36 (SUTURE)
SUT VIC AB 1 CTX36XBRD ANBCTR (SUTURE) IMPLANT
SUT VIC AB 2-0 CTX 36 (SUTURE) IMPLANT
SUT VIC AB 2-0 UR6 27 (SUTURE) IMPLANT
SUT VIC AB 3-0 MH 27 (SUTURE) IMPLANT
SUT VIC AB 3-0 X1 27 (SUTURE) ×4 IMPLANT
SUT VICRYL 2 TP 1 (SUTURE) IMPLANT
SYR 20CC LL (SYRINGE) IMPLANT
SYSTEM SAHARA CHEST DRAIN ATS (WOUND CARE) ×4 IMPLANT
TAPE CLOTH 4X10 WHT NS (GAUZE/BANDAGES/DRESSINGS) ×4 IMPLANT
TAPE CLOTH SURG 4X10 WHT LF (GAUZE/BANDAGES/DRESSINGS) ×2 IMPLANT
TIP APPLICATOR SPRAY EXTEND 16 (VASCULAR PRODUCTS) IMPLANT
TOWEL GREEN STERILE (TOWEL DISPOSABLE) ×4 IMPLANT
TOWEL GREEN STERILE FF (TOWEL DISPOSABLE) ×4 IMPLANT
TRAP SPECIMEN MUCOUS 40CC (MISCELLANEOUS) ×6 IMPLANT
TRAY FOLEY MTR SLVR 16FR STAT (SET/KITS/TRAYS/PACK) ×4 IMPLANT
TROCAR XCEL BLADELESS 5X75MML (TROCAR) ×6 IMPLANT
TROCAR XCEL NON-BLD 5MMX100MML (ENDOMECHANICALS) IMPLANT
WATER STERILE IRR 1000ML POUR (IV SOLUTION) ×8 IMPLANT

## 2018-05-27 NOTE — Interval H&P Note (Signed)
History and Physical Interval Note:  05/27/2018 7:21 AM  Jordan Potts  has presented today for surgery, with the diagnosis of LEFT PLEURAL EFFUSION.  The various methods of treatment have been discussed with the patient and family. After consideration of risks, benefits and other options for treatment, the patient has consented to  Procedure(s): VIDEO ASSISTED THORACOSCOPY (Left) DRAINAGE OF PLEURAL EFFUSION (Left) DECORTICATION (Left) as a surgical intervention.  The patient's history has been reviewed, patient examined, no change in status, stable for surgery.  I have reviewed the patient's chart and labs.  Questions were answered to the patient's satisfaction.     [name]

## 2018-05-27 NOTE — Anesthesia Procedure Notes (Signed)
Arterial Line Insertion Start/End3/20/2020 7:00 AM, 05/27/2018 7:15 AM Performed by: Orlie Dakin, CRNA, CRNA  Patient location: Pre-op. Preanesthetic checklist: patient identified, IV checked, risks and benefits discussed, surgical consent, monitors and equipment checked, pre-op evaluation and timeout performed Lidocaine 1% used for infiltration and patient sedated Left, radial was placed Catheter size: 20 G Hand hygiene performed  and maximum sterile barriers used   Attempts: 1 Procedure performed without using ultrasound guided technique. Following insertion, dressing applied and Biopatch. Post procedure assessment: normal  Patient tolerated the procedure well with no immediate complications.

## 2018-05-27 NOTE — Transfer of Care (Signed)
Immediate Anesthesia Transfer of Care Note  Patient: Jordan Potts  Procedure(s) Performed: VIDEO ASSISTED THORACOSCOPY (Left Chest) DRAINAGE OF PLEURAL EFFUSION (Left ) DECORTICATION (Left )  Patient Location: PACU  Anesthesia Type:General  Level of Consciousness: drowsy and patient cooperative  Airway & Oxygen Therapy: Patient Spontanous Breathing and Patient connected to face mask oxygen  Post-op Assessment: Report given to RN, Post -op Vital signs reviewed and stable and Patient moving all extremities X 4  Post vital signs: Reviewed and stable  Last Vitals:  Vitals Value Taken Time  BP 127/73 05/27/2018 10:05 AM  Temp    Pulse 69 05/27/2018 10:10 AM  Resp 15 05/27/2018 10:10 AM  SpO2 99 % 05/27/2018 10:10 AM  Vitals shown include unvalidated device data.  Last Pain:  Vitals:   05/27/18 0626  TempSrc:   PainSc: 0-No pain         Complications: No apparent anesthesia complications

## 2018-05-27 NOTE — Brief Op Note (Addendum)
05/27/2018  9:36 AM  PATIENT:  Jordan Potts  78 y.o. male  PRE-OPERATIVE DIAGNOSIS:  LEFT PLEURAL EFFUSION  POST-OPERATIVE DIAGNOSIS:  MALIGNANT LEFT PLEURAL EFFUSION  PROCEDURE:  Procedure(s):  VIDEO ASSISTED THORACOSCOPY (Left) DRAINAGE OF PLEURAL EFFUSION (Left) PLEURAL BIOPSY INSERTION OF PLEUR-X CATHER  SURGEON:  Surgeon(s) and Role:    * [name], MD - Primary  PHYSICIAN ASSISTANT: Ellwood Handler PA-C  ANESTHESIA:   general  EBL:  50 mL   BLOOD ADMINISTERED:none  DRAINS: 28 Blake Drain, Pleur-x Catheter   LOCAL MEDICATIONS USED:  MARCAINE    and BUPIVICAINE   SPECIMEN:  Source of Specimen:  Pleural Fluid, Pleural/Visceral Peel  DISPOSITION OF SPECIMEN:  pathology, cytology  COUNTS:  YES  TOURNIQUET:  * No tourniquets in log *  DICTATION: .Dragon Dictation  PLAN OF CARE: Admit to inpatient   PATIENT DISPOSITION:  PACU - hemodynamically stable.   Delay start of Pharmacological VTE agent (>24hrs) due to surgical blood loss or risk of bleeding: no

## 2018-05-27 NOTE — Discharge Summary (Addendum)
Physician Discharge Summary  Patient ID: Jordan Potts MRN: [id number] DOB/AGE: [birth year] 78 y.o.  Admit date: 05/27/2018 Discharge date: 05/30/2018  Admission Diagnoses:  Patient Active Problem List   Diagnosis Date Noted  . 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 Pleural effusion, left 04/26/2018  . Mild cognitive impairment 03/11/2017  . Memory loss 12/08/2016  . Encounter for long-term (current) use of high-risk medication 02/14/2015  . 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 Kidney stone 09/22/2011   Discharge Diagnoses: Malignant mesothelioma  Patient Active Problem List   Diagnosis Date Noted  . 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 S/P Left VATS drainage of pleural effusion, pleural biopsy 05/27/2018  . Pleural effusion, left 04/26/2018  . Mild cognitive impairment 03/11/2017  . Memory loss 12/08/2016  . Encounter for long-term (current) use of high-risk medication 02/14/2015  . 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 Kidney stone 09/22/2011   Discharged Condition: fair  History of Present Illness:  Jordan Potts is a 78 yo white male with known history of HTN, chronic myeloproliferative disease. Thrombocytosis, kidney stones, and early dementia.  He was being worked up for left upper quadrant discomfort.  Esophagram was performed and showed a large left pleural effusion.  CXR was then performed with confirmation of effusion.  He underwent thoracentesis on 2 separate occasions.  The first drained 1L and the second 800.  Cytologies were both negative.  LDH was markedly elevated and was consistent with exudate.  He was sent for Dr. Roxan Hockey for evaluation for VATS procedure.  At that visit the patient admitted poor appetite and weight loss.  He has some pain on his right side.  He also notes some shortness of breath, and his wife states this occurred with minimal activities.  It was felt the patient should undergo Left VATS with drainage of  pleural effusion.  The risks and benefits of the procedure were explained to the patient and he was agreeable to proceed.   Hospital Course:   Jordan Potts presented to [facility] on 05/27/2018.  He was taken to the operating room and underwent Left VATS with drainage of pleural effusion, insertion of Pleurx catheter, and pleural biopsy.  He tolerated the procedure without difficulty, was extubated, and taken to the PACU in stable condition.  The patient progressed without significant difficulty.  His chest tube was removed on 05/28/2018.  He started daily Pleur-x drainage and home health arrangements have been made.  He will need drainage every other day and should record the output.  He is ambulating.  His incisions are healing without evidence of infection.  He is medically stable for discharge home today.    Treatments: surgery:    Left video-assisted thoracoscopy for drainage of pleural effusion and pleural catheter placement.  Discharge Exam: Blood pressure (!) 134/97, pulse 91, temperature 98.6 F (37 C), temperature source Oral, resp. rate 17, SpO2 94 %. General appearance: alert, cooperative and no distress Heart: regular rate and rhythm Lungs: diminished breath sounds bibasilar Abdomen: soft, non-tender; bowel sounds normal; no masses,  no organomegaly Extremities: extremities normal, atraumatic, no cyanosis or edema Wound: clean and dry   Discharge disposition: 01-Home or Self Care  Discharge Medications:  Allergies as of 05/30/2018   No Known Allergies     Medication List    TAKE these medications   acetaminophen 500 MG tablet Commonly known as:  TYLENOL Take 1,000 mg by mouth every 8 (eight) hours as needed (pain).   amLODipine 5 MG tablet Commonly known  as:  NORVASC Take 5 mg by mouth daily.   hydroxyurea 500 MG capsule Commonly known as:  HYDREA TAKE 2 CAPSULES BY MOUTH DAILY What changed:    how much to take  when to take this  additional  instructions   ibuprofen 200 MG tablet Commonly known as:  ADVIL,MOTRIN Take 400 mg by mouth daily as needed (pain).   losartan 100 MG tablet Commonly known as:  COZAAR Take 100 mg by mouth daily.   oxyCODONE 5 MG immediate release tablet Commonly known as:  Oxy IR/ROXICODONE Take 1-2 tablets (5-10 mg total) by mouth every 4 (four) hours as needed for severe pain.   vitamin C 500 MG tablet Commonly known as:  ASCORBIC ACID Take 500 mg by mouth daily.   Vitamin D 50 MCG (2000 UT) Caps Take 2,000 Units by mouth daily.      Follow-up Information    [name], MD Follow up on 06/14/2018.   Specialty:  Cardiothoracic Surgery Why:  Appointment is at 11:30, please get CXR at 11:00 at [facility] located on first floor of our office building Contact information: [place] [address] [phone]           Signed:  Ellwood Handler PA-C 05/30/2018, 7:50 AM

## 2018-05-27 NOTE — Anesthesia Procedure Notes (Addendum)
Procedure Name: Intubation Date/Time: 05/27/2018 7:48 AM Performed by: Orlie Dakin, CRNA Pre-anesthesia Checklist: Patient identified, Emergency Drugs available, Suction available and Patient being monitored Patient Re-evaluated:Patient Re-evaluated prior to induction Oxygen Delivery Method: Circle system utilized Preoxygenation: Pre-oxygenation with 100% oxygen Induction Type: IV induction Ventilation: Mask ventilation without difficulty Laryngoscope Size: Glidescope and 4 Grade View: Grade I Tube type: Oral Endobronchial tube: Left and Double lumen EBT and 39 Fr Number of attempts: 2 Airway Equipment and Method: Stylet Placement Confirmation: ETT inserted through vocal cords under direct vision,  positive ETCO2 and breath sounds checked- equal and bilateral Tube secured with: Tape Dental Injury: Teeth and Oropharynx as per pre-operative assessment  Comments: 1st DL Mac 4, grade 4 view, large epiglottis.   2nd DL with Glidescope as above.  EBT position confirmed by EBT camera.  4x4s bite block used at end of case.

## 2018-05-27 NOTE — Op Note (Signed)
NAME: Jordan Potts, Jordan Potts MEDICAL RECORD [id number] ACCOUNT [id number] DATE OF BIRTH:11/03/1940 FACILITY: [facility] LOCATION: [facility] PHYSICIAN:Jalyn Rosero Chaya Jan, MD  OPERATIVE REPORT  DATE OF PROCEDURE:  05/27/2018  PREOPERATIVE DIAGNOSIS:  Left pleural effusion.  POSTOPERATIVE DIAGNOSIS:  Malignant left pleural effusion.  PROCEDURE:  Left video-assisted thoracoscopy for drainage of pleural effusion and pleural catheter placement.  SURGEON:  Modesto Charon, MD  ASSISTANT:  Ellwood Handler, PA  ANESTHESIA:  General.  FINDINGS:  Approximately 1.5 L of dark amber fluid. Some loculations with fibrinous exudate. Lingula and the majority of the left lower lobe trapped.  Frozen section of parietal pleural peel showed malignancy.  CLINICAL NOTE:  Jordan Potts is a 78 year old gentleman who presented with left upper quadrant abdominal discomfort and general malaise.  He was found to have a large left pleural effusion.  He had 2 thoracenteses.  Both cytologies were negative.  He was  referred for VATS.  The indications, risks, benefits, and alternatives were discussed in detail with the patient.  He understood and accepted the risks and agreed to proceed.  OPERATIVE NOTE:  The patient was brought to the operating room on 05/27/2018.  He had induction of general anesthesia and was intubated with a double-lumen endotracheal tube.  A Foley catheter was placed.  Intravenous antibiotics were administered.   Sequential compression devices were placed on the calves for DVT prophylaxis.  He was placed in a right lateral decubitus position, and the left chest was prepped and draped in the usual sterile fashion.  Single-lung ventilation of the right lung was  initiated and was tolerated well as would be expected.  A timeout was performed.  A solution containing 20 mL of liposomal bupivacaine 0.5%, bupivacaine 30 mL, and 50 mL of saline was prepared.  This was used for local at the skin incisions  as well as for intercostal nerve blocks.  An incision was made in the  7th interspace in the midaxillary line.  A sucker was advanced to the chest.  Approximately 1.5 L of fluid, which was dark amber in color, was evacuated.  A port was placed through the incision, and the thoracoscope was advanced to the chest.  There was  extensive fibrinous exudate and areas of loculated effusion.  An incision was made in the 4th interspace in the anterior axillary line.  No rib spreading was performed during the procedure.  The fibrinous adhesions were broken up, and the remainder of  the pleural fluid was evacuated.  The lingula and lower lobe were entrapped in a fibrous peel.  The parietal pleura had a nodular appearance to it.  Biopsies were taken from the pleura and sent for frozen section.  While awaiting those results, an initial attempt  was made to decorticate the lingula and lower lobe.  In some areas, the peel came off relatively easily, although was not necessarily the full thickness of the peel.  In other areas, it was densely adherent.  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  The decision was made to place a pleural catheter and not to further attempt to decorticate the lung.  A pleural catheter was placed into the pleural space and tunneled out and exited in the anterior chest wall near the costal  margin.  It was secured to the skin with a 3-0 nylon suture.  A 28-French Blake drain was placed through the original port incision and secured with a #1 silk suture.  Dual-lung ventilation was resumed.  The chest was copiously irrigated  with warm  saline.  Intercostal nerve blocks were performed from the 3rd to the 9th interspace.  A needle was inserted from a posterior approach, and 10 mL of the bupivacaine solution was injected into each interspace.  The chest tube was placed to suction.  The  patient was placed back in a supine position.  The pleural catheter was capped and dressed.   He was then extubated in the operating room and taken to the postanesthetic care unit in good condition.  All sponge, needle and instrument counts were correct  at the end of the procedure.  LN/NUANCE  D:05/27/2018 T:05/27/2018 JOB:[id number]

## 2018-05-28 ENCOUNTER — Inpatient Hospital Stay (HOSPITAL_COMMUNITY): Payer: Medicare Other

## 2018-05-28 LAB — GLUCOSE, BODY FLUID OTHER: Glucose, Body Fluid Other: 3 mg/dL

## 2018-05-28 LAB — BASIC METABOLIC PANEL
Anion gap: 8 (ref 5–15)
BUN: 10 mg/dL (ref 8–23)
CO2: 25 mmol/L (ref 22–32)
CREATININE: 0.85 mg/dL (ref 0.61–1.24)
Calcium: 9.2 mg/dL (ref 8.9–10.3)
Chloride: 106 mmol/L (ref 98–111)
GFR calc non Af Amer: >60 mL/min (ref >60)
Glucose, Bld: 162 mg/dL — ABNORMAL HIGH (ref 70–99)
Potassium: 3.7 mmol/L (ref 3.5–5.1)
Sodium: 139 mmol/L (ref 135–145)

## 2018-05-28 LAB — CBC
HCT: 30 % — ABNORMAL LOW (ref 39.0–52.0)
Hemoglobin: 9.7 g/dL — ABNORMAL LOW (ref 13.0–17.0)
MCH: 37.6 pg — ABNORMAL HIGH (ref 26.0–34.0)
MCHC: 32.3 g/dL (ref 30.0–36.0)
MCV: 116.3 fL — ABNORMAL HIGH (ref 80.0–100.0)
Platelets: 344 10*3/uL (ref 150–400)
RBC: 2.58 MIL/uL — ABNORMAL LOW (ref 4.22–5.81)
RDW: 13.5 % (ref 11.5–15.5)
WBC: 7.9 10*3/uL (ref 4.0–10.5)
nRBC: 0 % (ref 0.0–0.2)

## 2018-05-28 LAB — BLOOD GAS, ARTERIAL
Acid-Base Excess: 2.6 mmol/L — ABNORMAL HIGH (ref 0.0–2.0)
Bicarbonate: 27.4 mmol/L (ref 20.0–28.0)
O2 Saturation: 94.1 %
PATIENT TEMPERATURE: 98.6
pCO2 arterial: 48.1 mmHg — ABNORMAL HIGH (ref 32.0–48.0)
pH, Arterial: 7.374 (ref 7.350–7.450)
pO2, Arterial: 68.6 mmHg — ABNORMAL LOW (ref 83.0–108.0)

## 2018-05-28 LAB — PROTEIN, BODY FLUID (OTHER): Total Protein, Body Fluid Other: 2.7 g/dL

## 2018-05-28 MED ORDER — POTASSIUM CHLORIDE 20 MEQ PO PACK
40.0000 meq | PACK | Freq: Once | ORAL | Status: AC
  Administered 2018-05-28: 40 meq via ORAL
  Filled 2018-05-28: qty 2

## 2018-05-28 NOTE — Progress Notes (Signed)
Patient ambulated with moderate assistance from bed to chair for 1.5 hours. Tolerated well.

## 2018-05-28 NOTE — Progress Notes (Addendum)
[address]       [place] [address]             [phone]      1 Day Post-Op Procedure(s) (LRB): VIDEO ASSISTED THORACOSCOPY (Left) DRAINAGE OF PLEURAL EFFUSION (Left) DECORTICATION (Left) Subjective: Feeling pretty well  Objective: Vital signs in last 24 hours: Temp:  [97.5 F (36.4 C)-98.7 F (37.1 C)] 98.1 F (36.7 C) (03/21 0757) Pulse Rate:  [78-95] 86 (03/21 0927) Cardiac Rhythm: Normal sinus rhythm;Bundle branch block (03/21 0700) Resp:  [11-22] 18 (03/21 0927) BP: (112-144)/(64-82) 122/66 (03/21 0757) SpO2:  [93 %-100 %] 100 % (03/21 0927) Arterial Line BP: (144-186)/(53-83) 186/70 (03/20 1539)  Hemodynamic parameters for last 24 hours:    Intake/Output from previous day: 03/20 0701 - 03/21 0700 In: 3358.8 [P.O.:120; I.V.:3138.8; IV Piggyback:100] Out: 2130 [Urine:1250; Blood:50; Chest Tube:350] Intake/Output this shift: No intake/output data recorded.  General appearance: alert, cooperative and no distress Heart: regular rate and rhythm and frequent extrasystoles Lungs: dim on left Abdomen: soft, nontender Extremities: no edema or calf tenderness Wound: incis healing well  Lab Results: Recent Labs    05/25/18 1317 05/28/18 0515  WBC 5.3 7.9  HGB 11.1* 9.7*  HCT 33.8* 30.0*  PLT 423* 344   BMET:  Recent Labs    05/25/18 1317 05/28/18 0515  NA 137 139  K 3.6 3.7  CL 107 106  CO2 22 25  GLUCOSE 115* 162*  BUN 14 10  CREATININE 0.96 0.85  CALCIUM 9.1 9.2    PT/INR:  Recent Labs    05/25/18 1317  LABPROT 13.7  INR 1.1   ABG    Component Value Date/Time   PHART 7.374 05/28/2018 0515   HCO3 27.4 05/28/2018 0515   O2SAT 94.1 05/28/2018 0515   CBG (last 3)  No results for input(s): GLUCAP in the last 72 hours.  Meds Scheduled Meds: . acetaminophen  1,000 mg Oral Q6H   Or  . acetaminophen (TYLENOL) oral liquid 160 mg/5 mL  1,000 mg Oral Q6H  . amLODipine  5 mg Oral Daily  . bisacodyl  10 mg Oral  Daily  . enoxaparin (LOVENOX) injection  40 mg Subcutaneous Q24H  . hydroxyurea  1,000 mg Oral Daily  . levalbuterol  0.63 mg Nebulization Q6H  . losartan  100 mg Oral Daily  . metoCLOPramide (REGLAN) injection  10 mg Intravenous Q6H  . morphine   Intravenous Q4H  . senna-docusate  1 tablet Oral QHS   Continuous Infusions: . dextrose 5 % and 0.9% NaCl 1,000 mL (05/28/18 0356)  . potassium chloride     PRN Meds:.diphenhydrAMINE **OR** diphenhydrAMINE, naloxone **AND** sodium chloride flush, ondansetron (ZOFRAN) IV, oxyCODONE, potassium chloride, traMADol  Xrays Dg Chest Port 1 View  Result Date: 05/28/2018 CLINICAL DATA:  Postop left lung surgery. Malignant pleural effusion. EXAM: PORTABLE CHEST 1 VIEW COMPARISON:  05/27/2018 FINDINGS: Two chest tubes on the left remain in place. Left basilar pneumothorax appears slightly improved. Left lower lobe airspace disease and pleural effusion with mild interval improvement of aeration in the left base. Right lung remains clear. Calcified pleural plaque along the right hemidiaphragm. Negative for heart failure. IMPRESSION: Two chest tubes remain on the left. Left basilar pneumothorax slightly improved. Improved aeration left lung base. Electronically Signed   By: Franchot Gallo M.D.   On: 05/28/2018 08:48   Dg Chest Port 1 View  Result Date: 05/27/2018 CLINICAL DATA:  Status post left VATS. EXAM: PORTABLE CHEST  1 VIEW COMPARISON:  Chest x-ray dated May 25, 2018. FINDINGS: Stable cardiomediastinal silhouette. New left-sided chest tube and PleurX catheter. Essentially resolved left-sided pleural effusion with residual pleural thickening. Dense opacity in the lingula and left lower lobe. Slightly increased lucency of the left costophrenic angle may reflect small pneumothorax. The right lung is clear. No acute osseous abnormality. IMPRESSION: 1. Status post left pleural effusion drainage with new left-sided chest tube PleurX catheter. Slightly increased  lucency at the left costophrenic angle may reflect small pneumothorax. 2. Dense opacity in the lingula and left lower lobe likely reflects poor re-expansion of chronically collapsed lung. Electronically Signed   By: Titus Dubin M.D.   On: 05/27/2018 10:56    Chest tube : no air leak, 280 cc out yesterday, 70 today - serosang  Assessment/Plan: S/P Procedure(s) (LRB): VIDEO ASSISTED THORACOSCOPY (Left) DRAINAGE OF PLEURAL EFFUSION (Left) DECORTICATION (Left)  1 Doing well overall 2 hemodyn stable, + freq PVC's will replace K+ to 4.0, d/c aline 3 d/c chest tube 4 d/c foley 5 reduce IVF rate 6 sats good on RA, routine pulm toilet 7 normal renal fnxn 8 chronic macrocytic anemia with component of acute blood loss- follow 9 routine rehab  LOS: 1 day    John Giovanni PA-C 05/28/2018 Pager [phone]  Some discomfort from chest tube No leak Pleurx in place- will DC chest tube patient examined and medical record reviewed,agree with above note. Tharon Aquas Trigt III 05/28/2018

## 2018-05-28 NOTE — Progress Notes (Signed)
Chest Tube and Arterial line removed today, patient tolerated well. PCA pump discontinued today. Wasted 29ml of morphine with Tammy, RN Will continue to monitor.

## 2018-05-29 ENCOUNTER — Inpatient Hospital Stay (HOSPITAL_COMMUNITY): Payer: Medicare Other

## 2018-05-29 LAB — COMPREHENSIVE METABOLIC PANEL
ALT: 8 U/L (ref 0–44)
AST: 20 U/L (ref 15–41)
Albumin: 3 g/dL — ABNORMAL LOW (ref 3.5–5.0)
Alkaline Phosphatase: 81 U/L (ref 38–126)
Anion gap: 8 (ref 5–15)
BUN: 12 mg/dL (ref 8–23)
CO2: 27 mmol/L (ref 22–32)
Calcium: 9.2 mg/dL (ref 8.9–10.3)
Chloride: 102 mmol/L (ref 98–111)
Creatinine, Ser: 0.9 mg/dL (ref 0.61–1.24)
GFR calc Af Amer: >60 mL/min (ref >60)
GFR calc non Af Amer: >60 mL/min (ref >60)
Glucose, Bld: 115 mg/dL — ABNORMAL HIGH (ref 70–99)
Potassium: 4.2 mmol/L (ref 3.5–5.1)
Sodium: 137 mmol/L (ref 135–145)
Total Bilirubin: 1 mg/dL (ref 0.3–1.2)
Total Protein: 6.5 g/dL (ref 6.5–8.1)

## 2018-05-29 LAB — CBC
HCT: 32 % — ABNORMAL LOW (ref 39.0–52.0)
Hemoglobin: 10.8 g/dL — ABNORMAL LOW (ref 13.0–17.0)
MCH: 38.8 pg — ABNORMAL HIGH (ref 26.0–34.0)
MCHC: 33.8 g/dL (ref 30.0–36.0)
MCV: 115.1 fL — ABNORMAL HIGH (ref 80.0–100.0)
Platelets: 393 10*3/uL (ref 150–400)
RBC: 2.78 MIL/uL — ABNORMAL LOW (ref 4.22–5.81)
RDW: 13.4 % (ref 11.5–15.5)
WBC: 8.7 10*3/uL (ref 4.0–10.5)
nRBC: 0 % (ref 0.0–0.2)

## 2018-05-29 MED ORDER — LEVALBUTEROL HCL 0.63 MG/3ML IN NEBU: 0.6300 mg | INHALATION_SOLUTION | Freq: Four times a day (QID) | RESPIRATORY_TRACT | Status: DC | PRN

## 2018-05-29 NOTE — Progress Notes (Signed)
Pt voiding little amounts at times.  Bladder scan obtained.  182 cc noted.  Condom cath placed. Will continue to monitor Saunders Revel T

## 2018-05-29 NOTE — Progress Notes (Addendum)
[address]       [place] [address]             [phone]      2 Days Post-Op Procedure(s) (LRB): VIDEO ASSISTED THORACOSCOPY (Left) DRAINAGE OF PLEURAL EFFUSION (Left) DECORTICATION (Left) Subjective: Feels better today  Objective: Vital signs in last 24 hours: Temp:  [98.3 F (36.8 C)-98.8 F (37.1 C)] 98.8 F (37.1 C) (03/22 0755) Pulse Rate:  [85-94] 91 (03/22 0755) Cardiac Rhythm: Normal sinus rhythm (03/22 0755) Resp:  [14-20] 20 (03/22 0805) BP: (120-170)/(65-84) 160/71 (03/22 0805) SpO2:  [92 %-100 %] 98 % (03/22 0805)  Hemodynamic parameters for last 24 hours:    Intake/Output from previous day: 03/21 0701 - 03/22 0700 In: 839.9 [P.O.:240; I.V.:599.9] Out: 1850 [Urine:1700; Chest Tube:150] Intake/Output this shift: No intake/output data recorded.  General appearance: alert, cooperative and no distress Heart: regular rate and rhythm and + extrasystoles Lungs: dim on left Abdomen: benign Extremities: no edema Wound: incis healing well  Lab Results: Recent Labs    05/28/18 0515 05/29/18 0555  WBC 7.9 8.7  HGB 9.7* 10.8*  HCT 30.0* 32.0*  PLT 344 393   BMET:  Recent Labs    05/28/18 0515 05/29/18 0555  NA 139 137  K 3.7 4.2  CL 106 102  CO2 25 27  GLUCOSE 162* 115*  BUN 10 12  CREATININE 0.85 0.90  CALCIUM 9.2 9.2    PT/INR: No results for input(s): LABPROT, INR in the last 72 hours. ABG    Component Value Date/Time   PHART 7.374 05/28/2018 0515   HCO3 27.4 05/28/2018 0515   O2SAT 94.1 05/28/2018 0515   CBG (last 3)  No results for input(s): GLUCAP in the last 72 hours.  Meds Scheduled Meds:  acetaminophen  1,000 mg Oral Q6H   Or   acetaminophen (TYLENOL) oral liquid 160 mg/5 mL  1,000 mg Oral Q6H   amLODipine  5 mg Oral Daily   bisacodyl  10 mg Oral Daily   enoxaparin (LOVENOX) injection  40 mg Subcutaneous Q24H   hydroxyurea  1,000 mg Oral Daily   losartan  100 mg Oral Daily    senna-docusate  1 tablet Oral QHS   Continuous Infusions:  dextrose 5 % and 0.9% NaCl 50 mL/hr at 05/28/18 1252   PRN Meds:.levalbuterol, ondansetron (ZOFRAN) IV, oxyCODONE, traMADol  Xrays Dg Chest Port 1 View  Result Date: 05/29/2018 CLINICAL DATA:  78 year old male status post chest tube placement. EXAM: PORTABLE CHEST 1 VIEW COMPARISON:  Chest x-ray 05/28/2018. FINDINGS: One of the previously noted left-sided chest tubes has been removed. The other left-sided chest tube remains in stable position with tip near the apex of the left hemithorax. There appears to be a small amount of loculated left-sided pneumothorax in the mid to lower left hemithorax, which appears stable to the prior study. Irregular opacities throughout the left hemithorax likely reflect a combination of residual pleural fluid and areas of atelectasis and/or consolidation throughout the left lung. Right lung is clear. No right pleural effusion. No evidence of pulmonary edema. Heart size is normal. The patient is rotated to the right on today's exam, resulting in distortion of the mediastinal contours and reduced diagnostic sensitivity and specificity for mediastinal pathology. IMPRESSION: 1. Support apparatus, as above. 2. Stable partially loculated left-sided hydropneumothorax. Stable appearance of the left lung, which likely reflects significant areas of residual atelectasis. Electronically Signed   By: Vinnie Langton M.D.   On:  05/29/2018 06:51   Dg Chest Port 1 View  Result Date: 05/28/2018 CLINICAL DATA:  Postop left lung surgery. Malignant pleural effusion. EXAM: PORTABLE CHEST 1 VIEW COMPARISON:  05/27/2018 FINDINGS: Two chest tubes on the left remain in place. Left basilar pneumothorax appears slightly improved. Left lower lobe airspace disease and pleural effusion with mild interval improvement of aeration in the left base. Right lung remains clear. Calcified pleural plaque along the right hemidiaphragm. Negative for  heart failure. IMPRESSION: Two chest tubes remain on the left. Left basilar pneumothorax slightly improved. Improved aeration left lung base. Electronically Signed   By: Franchot Gallo M.D.   On: 05/28/2018 08:48   Dg Chest Port 1 View  Result Date: 05/27/2018 CLINICAL DATA:  Status post left VATS. EXAM: PORTABLE CHEST 1 VIEW COMPARISON:  Chest x-ray dated May 25, 2018. FINDINGS: Stable cardiomediastinal silhouette. New left-sided chest tube and PleurX catheter. Essentially resolved left-sided pleural effusion with residual pleural thickening. Dense opacity in the lingula and left lower lobe. Slightly increased lucency of the left costophrenic angle may reflect small pneumothorax. The right lung is clear. No acute osseous abnormality. IMPRESSION: 1. Status post left pleural effusion drainage with new left-sided chest tube PleurX catheter. Slightly increased lucency at the left costophrenic angle may reflect small pneumothorax. 2. Dense opacity in the lingula and left lower lobe likely reflects poor re-expansion of chronically collapsed lung. Electronically Signed   By: Titus Dubin M.D.   On: 05/27/2018 10:56    Assessment/Plan: S/P Procedure(s) (LRB): VIDEO ASSISTED THORACOSCOPY (Left) DRAINAGE OF PLEURAL EFFUSION (Left) DECORTICATION (Left) 1 doing well 2 hemodyn stable , BP is variable- cont current HTN meds 3 H/H improved a little 4 no leukocytosis or fever 5 rhythm stable- PVC's are chronic, K+ improved 6 CXR pretty stable, will drain pleurx today 7 poss home in am   LOS: 2 days    John Giovanni [facility] 05/29/2018 Pager [phone]  Breathing comfortably on room air, chest tube out Should be ready for discharge and home health nursing directed Pleurx catheter drainage Patient questioning his pathology. patient examined and medical record reviewed,agree with above note. Tharon Aquas Trigt III 05/29/2018

## 2018-05-29 NOTE — Progress Notes (Signed)
Patient pleurx drained today, output 250 mL. Pt tolerated well will continue to monitor.

## 2018-05-30 ENCOUNTER — Encounter (HOSPITAL_COMMUNITY): Payer: Self-pay | Admitting: Thoracic Surgery (Cardiothoracic Vascular Surgery)

## 2018-05-30 MED ORDER — OXYCODONE HCL 5 MG PO TABS: 5.0000 mg | ORAL_TABLET | ORAL | 0 refills | Status: AC | PRN

## 2018-05-30 MED ORDER — LACTULOSE 10 GM/15ML PO SOLN
20.0000 g | Freq: Once | ORAL | Status: AC
  Administered 2018-05-30: 20 g via ORAL
  Filled 2018-05-30: qty 30

## 2018-05-30 NOTE — TOC Initial Note (Addendum)
Transition of Care [facility]) - Initial/Assessment Note    Patient Details  Name: Jordan Potts MRN: [id number] Date of Birth: 1940/05/29  Transition [facility] CM/SW Contact:    Maryclare Labrador, RN Phone Number: 05/30/2018, 8:30 AM  Clinical Narrative:     PTA independent from home with wife - pt has cane in the home but wasn't using prior to admit.  Pt has PCP.  Pt will discharge home with [facility] and pleurx catheter.  CM provided medicare.gov HH list with quality measures based on accepting [facility] agencies ; barriers include insurance and [facility] agencies that will accept pleurx catheters, pt needs daily drainage and wife needs additonal teaching however current restrictions on visitation will not allow ongoing teaching for pt while hospitalized - bedside nurse will continue to teach pt and provide whatever instruction possible to wife - CM will attempt to get [facility] agency with next day care so wife can be trained face to face   Update:  [facility] accepted pt and can provide next day service and will train wife. Bedside nurse will drain catheter prior to discharge.  Pt will discharge home with 18 bottles due to drainage needing to be performed daily.              Expected Discharge Plan: [place]     Patient Goals and CMS Choice Patient states their goals for this hospitalization and ongoing recovery are:: Pt is eager to play games on his tablet      Expected Discharge Plan and Services Expected Discharge Plan: [place]         Expected Discharge Date: 05/30/18                        Prior Living Arrangements/Services                       Activities of Daily Living Home Assistive Devices/Equipment: Kasandra Knudsen (specify quad or straight), Eyeglasses ADL Screening (condition at time of admission) Patient's cognitive ability adequate to safely complete daily activities?: Yes Is the patient deaf or have difficulty hearing?: No Does the patient have  difficulty seeing, even when wearing glasses/contacts?: No Does the patient have difficulty concentrating, remembering, or making decisions?: Yes Patient able to express need for assistance with ADLs?: Yes Does the patient have difficulty dressing or bathing?: No Independently performs ADLs?: Yes (appropriate for developmental age) Does the patient have difficulty walking or climbing stairs?: Yes Weakness of Legs: Both Weakness of Arms/Hands: None  Permission Sought/Granted                  Emotional Assessment              Admission diagnosis:  LEFT PLEURAL EFFUSION Patient Active Problem List   Diagnosis Date Noted  . 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 S/P Left VATS drainage of pleural effusion, pleural biopsy 05/27/2018  . Pleural effusion, left 04/26/2018  . Mild cognitive impairment 03/11/2017  . Memory loss 12/08/2016  . Encounter for long-term (current) use of high-risk medication 02/14/2015  . 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 Kidney stone 09/22/2011   PCP:  Hulan Fess, MD Pharmacy:   [facility], [place] - [address] [place] [address] Phone: [phone] Fax: [phone]     Social Determinants of Health (SDOH) Interventions    Readmission Risk Interventions No flowsheet  data found.

## 2018-05-30 NOTE — Progress Notes (Addendum)
      [address]       [place] [address]             [phone]      3 Days Post-Op Procedure(s) (LRB): VIDEO ASSISTED THORACOSCOPY (Left) DRAINAGE OF PLEURAL EFFUSION (Left) DECORTICATION (Left)   Subjective:  Patient states he needs to move his bowels.  Otherwise no complaints.  Objective: Vital signs in last 24 hours: Temp:  [98.6 F (37 C)-99.3 F (37.4 C)] 98.6 F (37 C) (03/23 0105) Pulse Rate:  [91] 91 (03/22 0755) Cardiac Rhythm: Normal sinus rhythm (03/23 0700) Resp:  [17-20] 17 (03/22 1927) BP: (119-170)/(55-97) 134/97 (03/23 0105) SpO2:  [94 %-100 %] 94 % (03/23 0105)  Intake/Output from previous day: 03/22 0701 - 03/23 0700 In: 120 [P.O.:120] Out: 1200 [Urine:1200]  General appearance: alert, cooperative and no distress Heart: regular rate and rhythm Lungs: diminished breath sounds bibasilar Abdomen: soft, non-tender; bowel sounds normal; no masses,  no organomegaly Extremities: extremities normal, atraumatic, no cyanosis or edema Wound: clean and dry  Lab Results: Recent Labs    05/28/18 0515 05/29/18 0555  WBC 7.9 8.7  HGB 9.7* 10.8*  HCT 30.0* 32.0*  PLT 344 393   BMET:  Recent Labs    05/28/18 0515 05/29/18 0555  NA 139 137  K 3.7 4.2  CL 106 102  CO2 25 27  GLUCOSE 162* 115*  BUN 10 12  CREATININE 0.85 0.90  CALCIUM 9.2 9.2    PT/INR: No results for input(s): LABPROT, INR in the last 72 hours. ABG    Component Value Date/Time   PHART 7.374 05/28/2018 0515   HCO3 27.4 05/28/2018 0515   O2SAT 94.1 05/28/2018 0515   CBG (last 3)  No results for input(s): GLUCAP in the last 72 hours.  Assessment/Plan: S/P Procedure(s) (LRB): VIDEO ASSISTED THORACOSCOPY (Left) DRAINAGE OF PLEURAL EFFUSION (Left) DECORTICATION (Left)  1. CV- hemodynamically stable, on home regimen of BP medications 2. Pulm- continue draining Pleur-x catheter daily until output decreases 3. GI- lactulose once to help achieve BM 4.  Dispo- patient is stable for discharge, will need to ensure home health arrangements have been made prior to discharge   LOS: 3 days    Ellwood Handler 05/30/2018 patient seen and examined, agree with above Path still pending  Remo Lipps C. Roxan Hockey, MD [facility] [phone]

## 2018-05-30 NOTE — Plan of Care (Signed)
DC instructions given to patient. Questions answered, VSS. PIV DC, hemostasis achieved. All belongings sent home with patient. DC delayed d/t awaiting pleur-x drainage bulb supplies to send home. Will drain pleur-x prior to DC and change dressing. Home health to drain pleur-x tomorrow and education/instruct wife. Will continue to monitor until time of DC.  Pt escorted by NT via wheelchair to private vehicle driven by spouse.    Problem: Education: Goal: Knowledge of General Education information will improve Description Including pain rating scale, medication(s)/side effects and non-pharmacologic comfort measures Outcome: Progressing   Problem: Health Behavior/Discharge Planning: Goal: Ability to manage health-related needs will improve Outcome: Progressing   Problem: Clinical Measurements: Goal: Ability to maintain clinical measurements within normal limits will improve Outcome: Adequate for Discharge Goal: Will remain free from infection Outcome: Adequate for Discharge Goal: Diagnostic test results will improve Outcome: Adequate for Discharge Goal: Respiratory complications will improve Outcome: Completed/Met Goal: Cardiovascular complication will be avoided Outcome: Completed/Met   Problem: Activity: Goal: Risk for activity intolerance will decrease Outcome: Adequate for Discharge   Problem: Nutrition: Goal: Adequate nutrition will be maintained Outcome: Adequate for Discharge   Problem: Coping: Goal: Level of anxiety will decrease Outcome: Progressing   Problem: Elimination: Goal: Will not experience complications related to bowel motility Outcome: Progressing Goal: Will not experience complications related to urinary retention Outcome: Completed/Met   Problem: Pain Managment: Goal: General experience of comfort will improve Outcome: Completed/Met   Problem: Safety: Goal: Ability to remain free from injury will improve Outcome: Completed/Met   Problem: Skin  Integrity: Goal: Risk for impaired skin integrity will decrease Outcome: Completed/Met

## 2018-05-30 NOTE — Anesthesia Postprocedure Evaluation (Signed)
Anesthesia Post Note  Patient: Jordan Potts  Procedure(s) Performed: VIDEO ASSISTED THORACOSCOPY (Left Chest) DRAINAGE OF PLEURAL EFFUSION (Left ) DECORTICATION (Left )     Patient location during evaluation: PACU Anesthesia Type: General Level of consciousness: sedated and patient cooperative Pain management: pain level controlled Vital Signs Assessment: post-procedure vital signs reviewed and stable Respiratory status: spontaneous breathing Cardiovascular status: stable Anesthetic complications: no    Last Vitals:  Vitals:   05/30/18 0720 05/30/18 1123  BP: 134/87 (!) 161/75  Pulse: 94   Resp: 19 (!) 21  Temp: 36.9 C 36.9 C  SpO2: 95%     Last Pain:  Vitals:   05/30/18 1314  TempSrc:   PainSc: [place]

## 2018-05-30 NOTE — Discharge Instructions (Signed)
Please Drain Pleur-x catheter every other day... Record output  Video-Assisted Thoracic Surgery, Care After This sheet gives you information about how to care for yourself after your procedure. Your health care provider may also give you more specific instructions. If you have problems or questions, contact your health care provider. What can I expect after the procedure? After the procedure, it is common to have:  Some pain and soreness in your chest.  Pain when breathing in (inhaling) and coughing.  Constipation.  Fatigue.  Difficulty sleeping. Follow these instructions at home: Preventing pneumonia  Take deep breaths or do breathing exercises as instructed by your health care provider. Doing this helps prevent lung infection (pneumonia).  Cough frequently. Coughing may cause discomfort, but it is important to clear mucus (phlegm) and expand your lungs. If it hurts to cough, hold a pillow against your chest or place the palms of both hands on top of the incision (use splinting) when you cough. This may help relieve discomfort.  If you were given an incentive spirometer, use it as directed. An incentive spirometer is a tool that measures how well you are filling your lungs with each breath.  Participate in pulmonary rehabilitation as directed by your health care provider. This is a program that combines education, exercise, and support from a team of specialists. The goal is to help you heal and get back to your normal activities as soon as possible. Medicines  Take over-the-counter or prescription medicines only as told by your health care provider.  If you have pain, take pain-relieving medicine before your pain becomes severe. This is important because if your pain is under control, you will be able to breathe and cough more comfortably.  If you were prescribed an antibiotic medicine, take it as told by your health care provider. Do not stop taking the antibiotic even if you start  to feel better. Activity  Ask your health care provider what activities are safe for you.  Avoid activities that use your chest muscles for at least 3-4 weeks.  Do not lift anything that is heavier than 10 lb (4.5 kg), or the limit that your health care provider tells you, until he or she says that it is safe. Incision care  Follow instructions from your health care provider about how to take care of your incision(s). Make sure you: ? Wash your hands with soap and water before you change your bandage (dressing). If soap and water are not available, use hand sanitizer. ? Change your dressing as told by your health care provider. ? Leave stitches (sutures), skin glue, or adhesive strips in place. These skin closures may need to stay in place for 2 weeks or longer. If adhesive strip edges start to loosen and curl up, you may trim the loose edges. Do not remove adhesive strips completely unless your health care provider tells you to do that.  Keep your dressing dry until it has been removed.  Check your incision area every day for signs of infection. Check for: ? Redness, swelling, or pain. ? Fluid or blood. ? Warmth. ? Pus or a bad smell. Bathing  Do not take baths, swim, or use a hot tub until your health care provider approves. You may take showers.  After your dressing has been removed, use soap and water to gently wash your incision area. Do not use anything else to clean your incision(s) unless your health care provider tells you to do this. Driving   Do not drive until  your health care provider approves.  Do not drive or use heavy machinery while taking prescription pain medicine. Eating and drinking  Eat a healthy, balanced diet as instructed by your health care provider. A healthy diet includes plenty of fresh fruits and vegetables, whole grains, and low-fat (lean) proteins.  Limit foods that are high in fat and processed sugars, such as fried and sweet foods.  Drink  enough fluid to keep your urine clear or pale yellow. General instructions   To prevent or treat constipation while you are taking prescription pain medicine, your health care provider may recommend that you: ? Take over-the-counter or prescription medicines. ? Eat foods that are high in fiber, such as beans, fresh fruits and vegetables, and whole grains.  Do not use any products that contain nicotine or tobacco, such as cigarettes and e-cigarettes. If you need help quitting, ask your health care provider.  Avoid secondhand smoke.  Wear compression stockings as told by your health care provider. These stockings help to prevent blood clots and reduce swelling in your legs.  If you have a chest tube, care for it as instructed by your health care provider. Do not travel by airplane during the 2 weeks after your chest tube is removed, or until your health care provider says that this is safe.  Keep all follow-up visits as told by your health care provider. This is important. Contact a health care provider if:  You have redness, swelling, or pain around an incision.  You have fluid or blood coming from an incision.  Your incision area feels warm to the touch.  You have pus or a bad smell coming from an incision.  You have a fever or chills.  You have nausea or vomiting.  You have pain that does not get better with medicine. Get help right away if:  You have chest pain.  Your heart is fluttering or beating rapidly.  You develop a rash.  You have shortness of breath or trouble breathing.  You are confused.  You have trouble speaking.  You feel weak, light-headed, or dizzy.  You faint. Summary  To help prevent lung infection (pneumonia), take deep breaths or do breathing exercises as instructed by your health care provider.  Cough frequently to clear mucus (phlegm) and expand your lungs. If it hurts to cough, hold a pillow against your chest or place the palms of both  hands on top of the incision (use splinting) when you cough.  If you have pain, take pain-relieving medicine before your pain becomes severe. This is important because if your pain is under control, you will be able to breathe and cough more comfortably.  Ask your health care provider what activities are safe for you. This information is not intended to replace advice given to you by your health care provider. Make sure you discuss any questions you have with your health care provider. Document Released: 06/20/2012 Document Revised: 02/03/2016 Document Reviewed: 02/03/2016 Elsevier Interactive Patient Education  2019 [employer].

## 2018-05-31 ENCOUNTER — Telehealth: Payer: Self-pay

## 2018-05-31 DIAGNOSIS — Z483 Aftercare following surgery for neoplasm: Secondary | ICD-10-CM | POA: Diagnosis not present

## 2018-05-31 LAB — PATHOLOGIST SMEAR REVIEW

## 2018-05-31 NOTE — Telephone Encounter (Signed)
Patient's wife, Izora Gala contacted this morning in regards to medication.  Patient received prescription for nausea from PA last night.  She stated that there was some confusion about the medication at the pharmacy, but it has been resolved.

## 2018-05-31 NOTE — Telephone Encounter (Signed)
-----   Message from [name], MD sent at 05/31/2018  9:43 AM EDT ----- Please don't send staff message at 4: 45 in the afternoon. No way that will be seen in time to do anything about it  [facility] ----- Message ----- From: Donnella Sham, RN Sent: 05/30/2018   4:46 PM EDT To: [name], MD  Hey,  Patient daughter is now calling asking for medication for nausea.  Patient was discharged today.  Please advise.  Thanks,  Caryl Pina

## 2018-06-01 ENCOUNTER — Telehealth: Payer: Self-pay

## 2018-06-01 ENCOUNTER — Telehealth: Payer: Self-pay | Admitting: Thoracic Surgery (Cardiothoracic Vascular Surgery)

## 2018-06-01 LAB — LACTATE DEHYDROGENASE, ISOENZYMES
LDH 1: 26 % (ref 17–32)
LDH 2: 33 % (ref 25–40)
LDH 3: 21 % (ref 17–27)
LDH 4: 12 % (ref 5–13)
LDH 5: 8 % (ref 4–20)
LDH Isoenzymes, Total: 147 IU/L (ref 121–224)

## 2018-06-01 LAB — CULTURE, BODY FLUID W GRAM STAIN -BOTTLE: Culture: NO GROWTH

## 2018-06-01 LAB — CULTURE, BODY FLUID-BOTTLE

## 2018-06-01 NOTE — Telephone Encounter (Signed)
I called Jordan Potts re: path results  I spoke with Jordan Potts and Jordan Potts. Biopsies showed malignant mesothelioma  He is having pain with pleural catheter drainage.  Requesting referral to Jordan Potts who has treated him in the past- will have my office arrange  Jordan Potts. Jordan Hockey, MD [facility] [phone]

## 2018-06-01 NOTE — Telephone Encounter (Signed)
Kathrynn Speed with [employer] contacted the office [phone] requesting verbal orders for Home Health nurse to drain and teach PleurX drainage system to patient and family.  Verbal orders given for 2 times a week for 2 weeks, and 1 time a week for 2 weeks.  Will await faxed copy for physician signature.

## 2018-06-02 ENCOUNTER — Other Ambulatory Visit: Payer: Self-pay | Admitting: *Deleted

## 2018-06-02 NOTE — Progress Notes (Signed)
The proposed treatment discussed in cancer conference 06/02/2018 is for discussion purpose only an dis not a binding recommendation.  The patient was not physically examined nor present for their treatment options.  Therefore, final treatment plans cannot be decided.  

## 2018-06-03 ENCOUNTER — Encounter: Payer: Self-pay | Admitting: *Deleted

## 2018-06-03 ENCOUNTER — Telehealth: Payer: Self-pay

## 2018-06-03 NOTE — Telephone Encounter (Signed)
Home health nurse is requesting to drain pleurX on Mon/Wed/Fridays. Patient is having a lot of discomfort with the draining of his pleurX. He is only able to tolerate about 200-250cc's per drain.  The order for draining on mon/wed/friday was ok'ed

## 2018-06-05 NOTE — Progress Notes (Signed)
Reached out to [employer] to introduce myself as the office RN Navigator and explain our new patient process. Spoke to the patient's wife as the patient was sleeping.  Explained my purpose for call. She then explained that patient already had a Hem Onc MD at [facility] and had a follow up appointment with them on Monday. She explained that she had notified Dr Hendrickson's office today. At this time we will defer on this referral, but I told the patient's wife to call us if at any time they needed our services. She appreciated the.call.

## 2018-06-06 LAB — VIRUS CULTURE

## 2018-06-07 ENCOUNTER — Ambulatory Visit: Payer: Medicare Other | Admitting: Hematology

## 2018-06-07 ENCOUNTER — Other Ambulatory Visit: Payer: Medicare Other

## 2018-06-07 ENCOUNTER — Ambulatory Visit: Payer: Medicare Other | Admitting: Emergency Medicine

## 2018-06-14 ENCOUNTER — Encounter: Payer: Self-pay | Admitting: Thoracic Surgery (Cardiothoracic Vascular Surgery)

## 2018-06-21 ENCOUNTER — Ambulatory Visit: Payer: Self-pay | Admitting: Thoracic Surgery (Cardiothoracic Vascular Surgery)

## 2018-06-28 LAB — FUNGUS CULTURE WITH STAIN

## 2018-06-28 LAB — FUNGAL ORGANISM REFLEX

## 2018-06-28 LAB — FUNGUS CULTURE RESULT

## 2018-07-10 LAB — ACID FAST CULTURE WITH REFLEXED SENSITIVITIES (MYCOBACTERIA): Acid Fast Culture: NEGATIVE

## 2018-07-14 ENCOUNTER — Ambulatory Visit: Payer: Medicare Other | Admitting: Neurology

## 2018-07-26 ENCOUNTER — Ambulatory Visit: Payer: Medicare Other | Admitting: Neurology

## 2018-09-06 ENCOUNTER — Encounter: Payer: Self-pay | Admitting: *Deleted

## 2018-09-06 DIAGNOSIS — C450 Mesothelioma of pleura: Secondary | ICD-10-CM | POA: Insufficient documentation

## 2018-10-06 ENCOUNTER — Telehealth: Payer: Self-pay | Admitting: *Deleted

## 2018-10-06 NOTE — Telephone Encounter (Signed)
Records faxed to [employer] - Bridgett Larsson - Release [id number]

## 2018-11-08 DEATH — deceased

## 2019-08-28 IMAGING — DX CHEST - 2 VIEW
2 series · 2 of 2 positions shown · non-contrast
Comparison: Chest x-ray 05/10/2018 and chest CT 05/02/2018

CLINICAL DATA: Evaluate left pleural effusion. Patient had a
thoracentesis on 05/10/2018 with 800 cc of pleural fluid removed.

EXAM:
CHEST - 2 VIEW

[dg chest 2 view (1 of 2)]
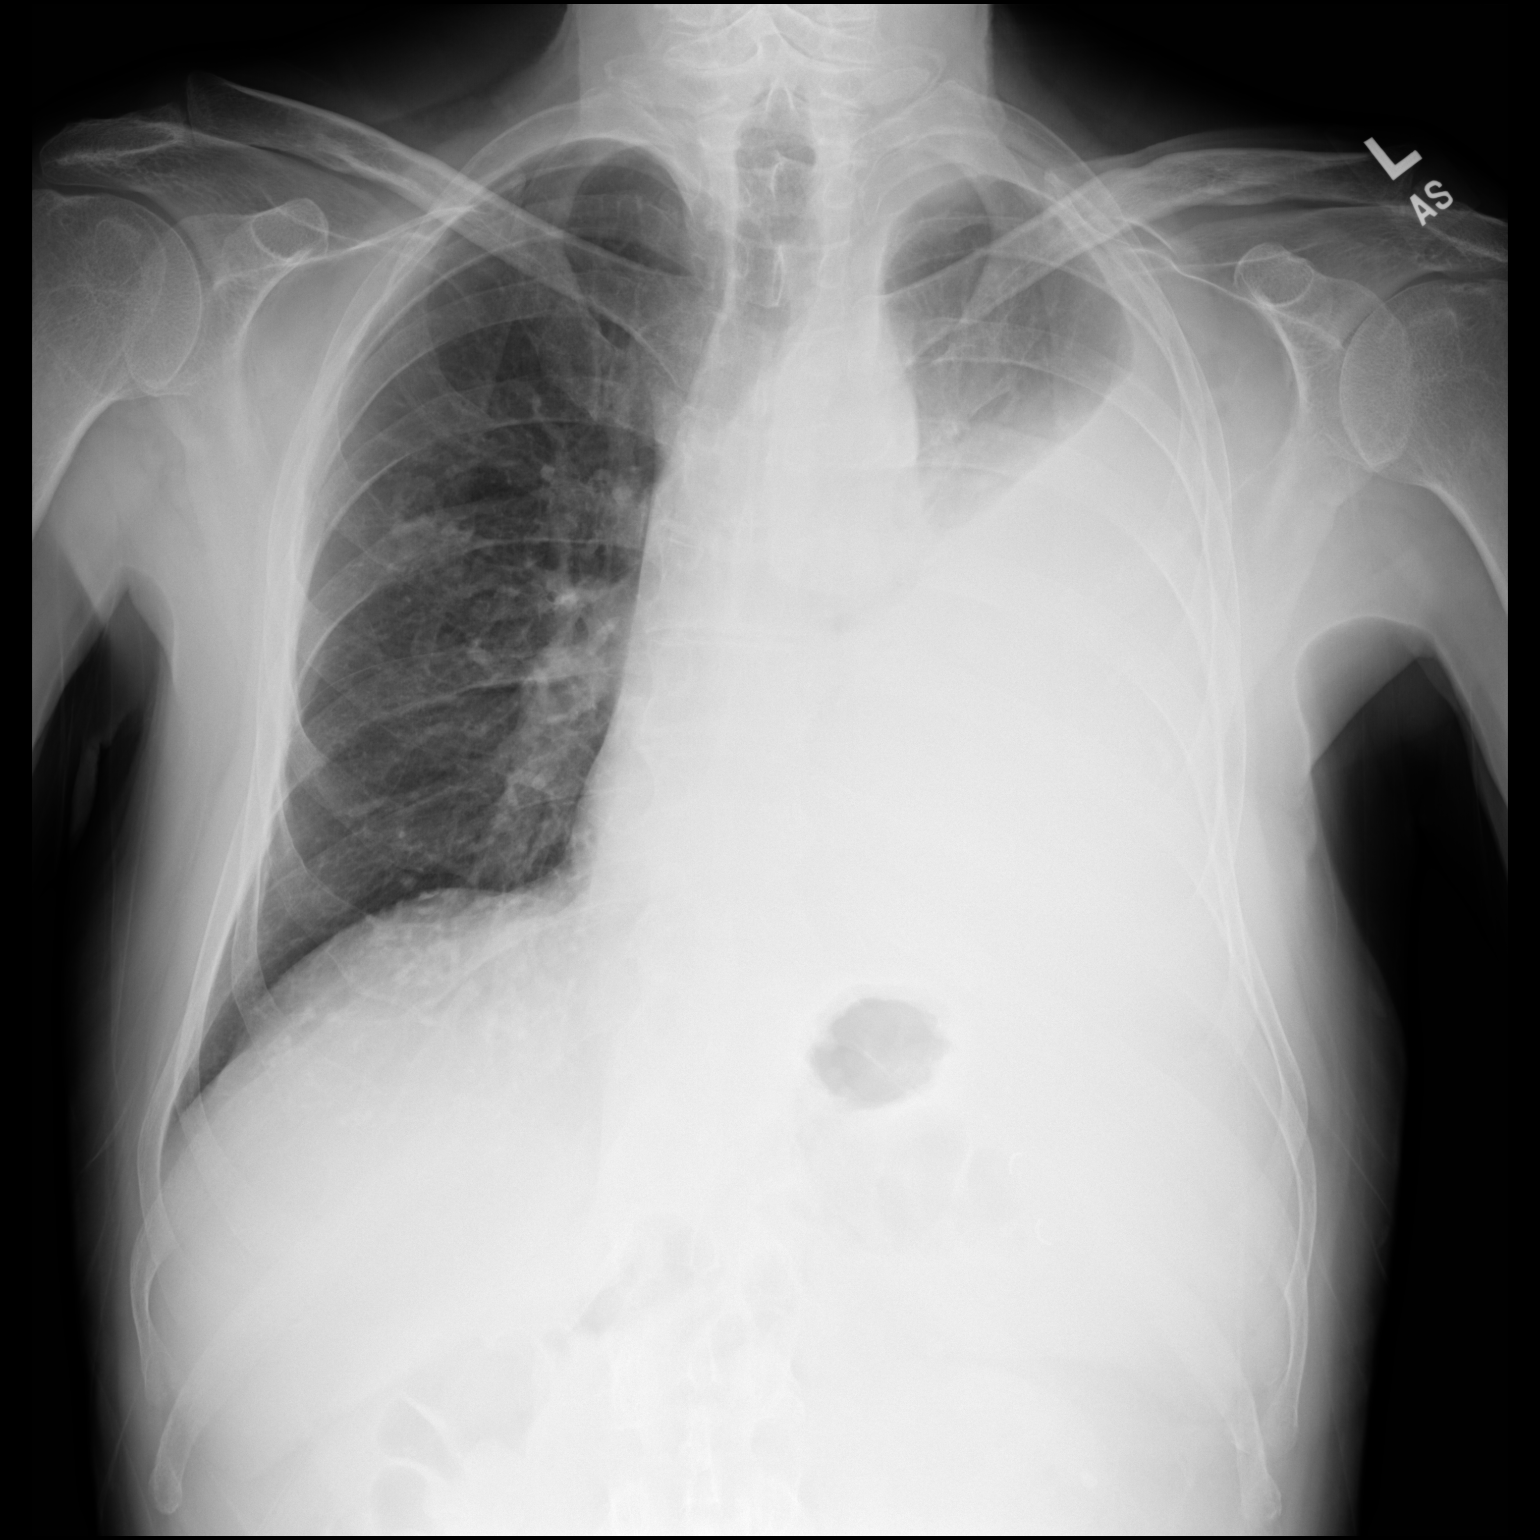

[dg chest 2 view (2 of 2)]
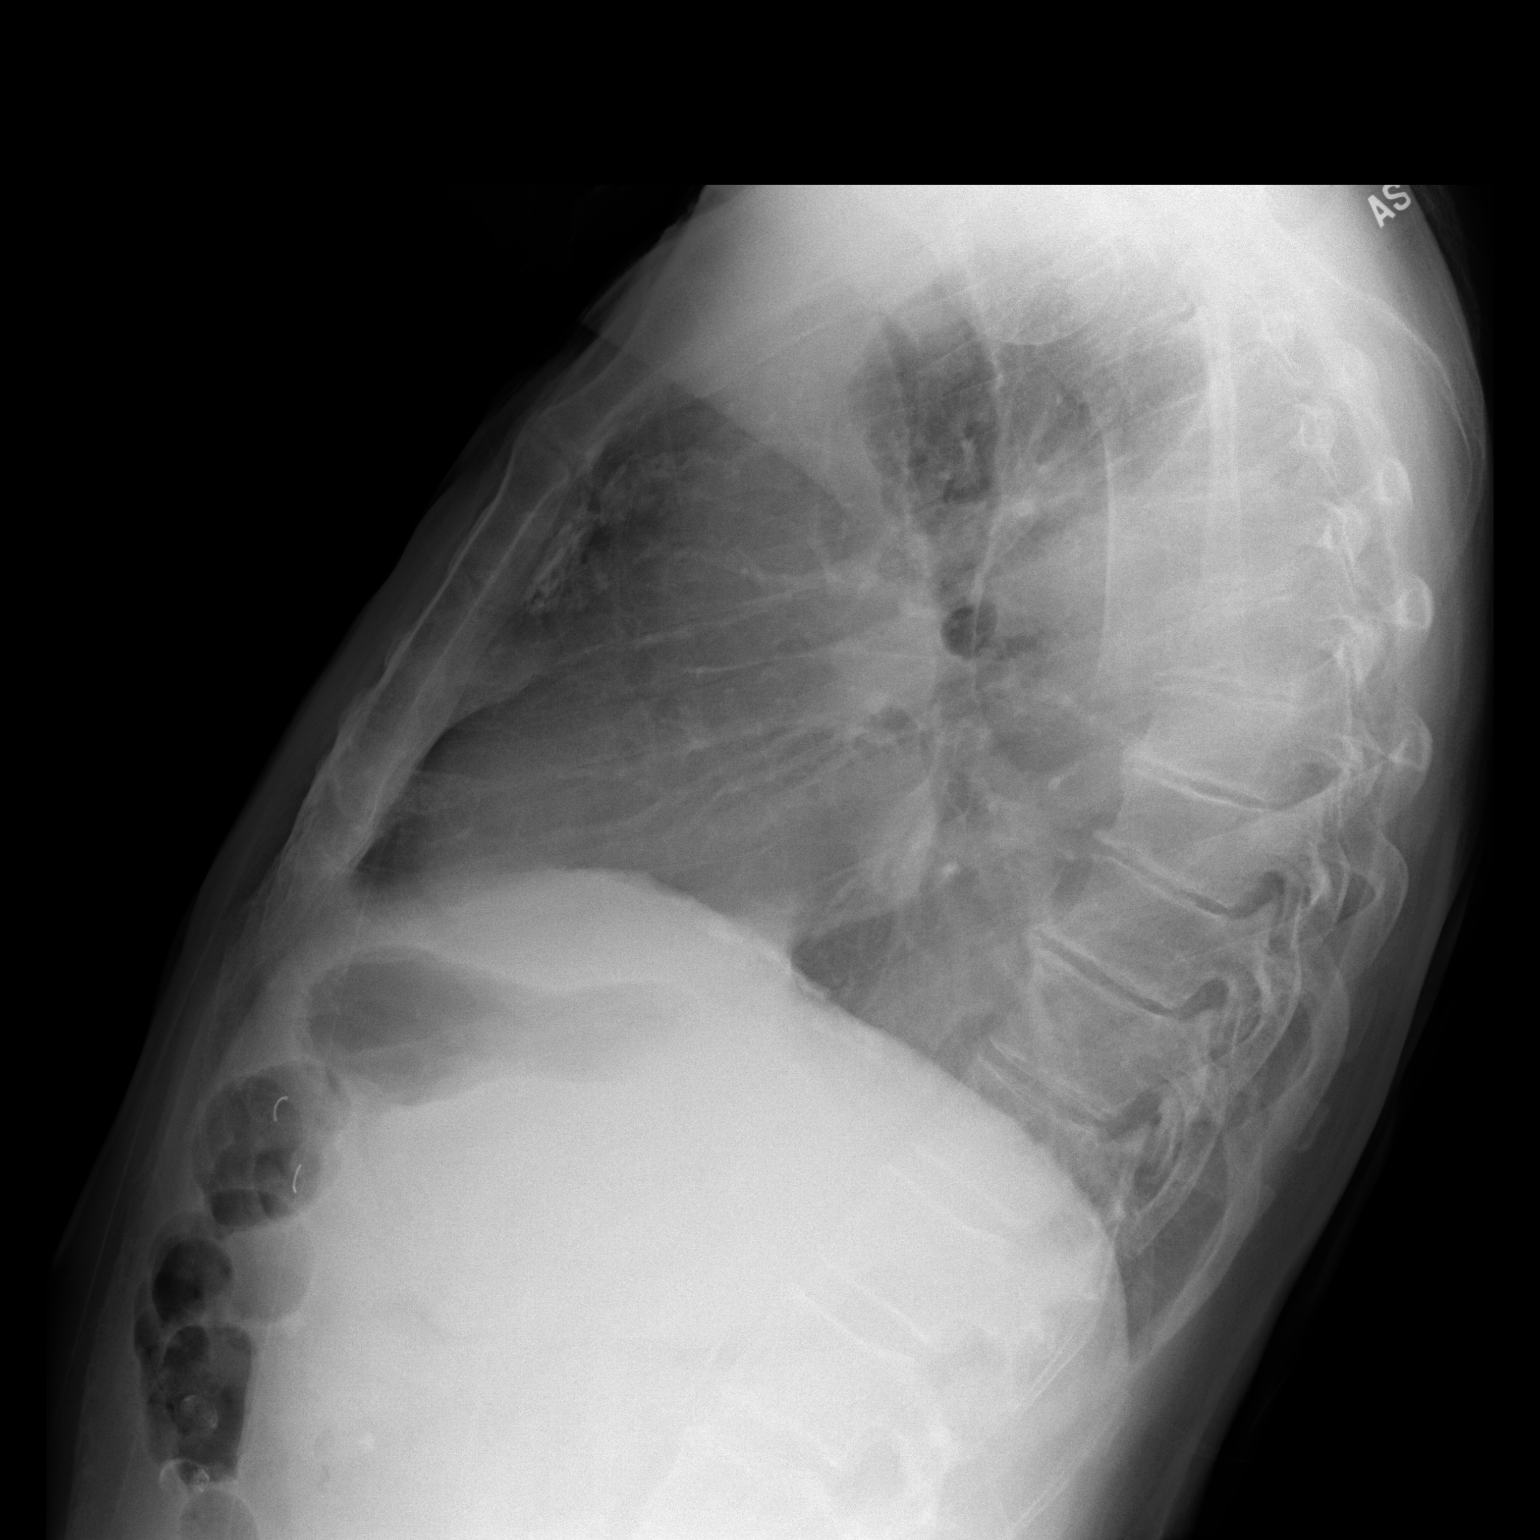

[2 of 2 positions shown; findings below may reference images not displayed]

FINDINGS: The cardiac silhouette, mediastinal and hilar contours are within
normal limits and stable. There is a persistent and slightly larger
large left pleural effusion. There is only a small amount of aerated
left upper lobe. The right lung remains relatively clear. No
right-sided pleural effusion. The bony thorax is intact.
IMPRESSION: Large left pleural effusion slightly increased since prior chest
film from 05/10/2018. Small amount of residual aerated left upper
lobe.

No right-sided pleural effusion.

## 2019-09-07 IMAGING — CR PORTABLE CHEST - 1 VIEW
1 series · 1 of 1 positions shown · non-contrast
Comparison: Chest x-ray dated May 25, 2018.

CLINICAL DATA: Status post left VATS.

EXAM:
PORTABLE CHEST 1 VIEW

[AP]
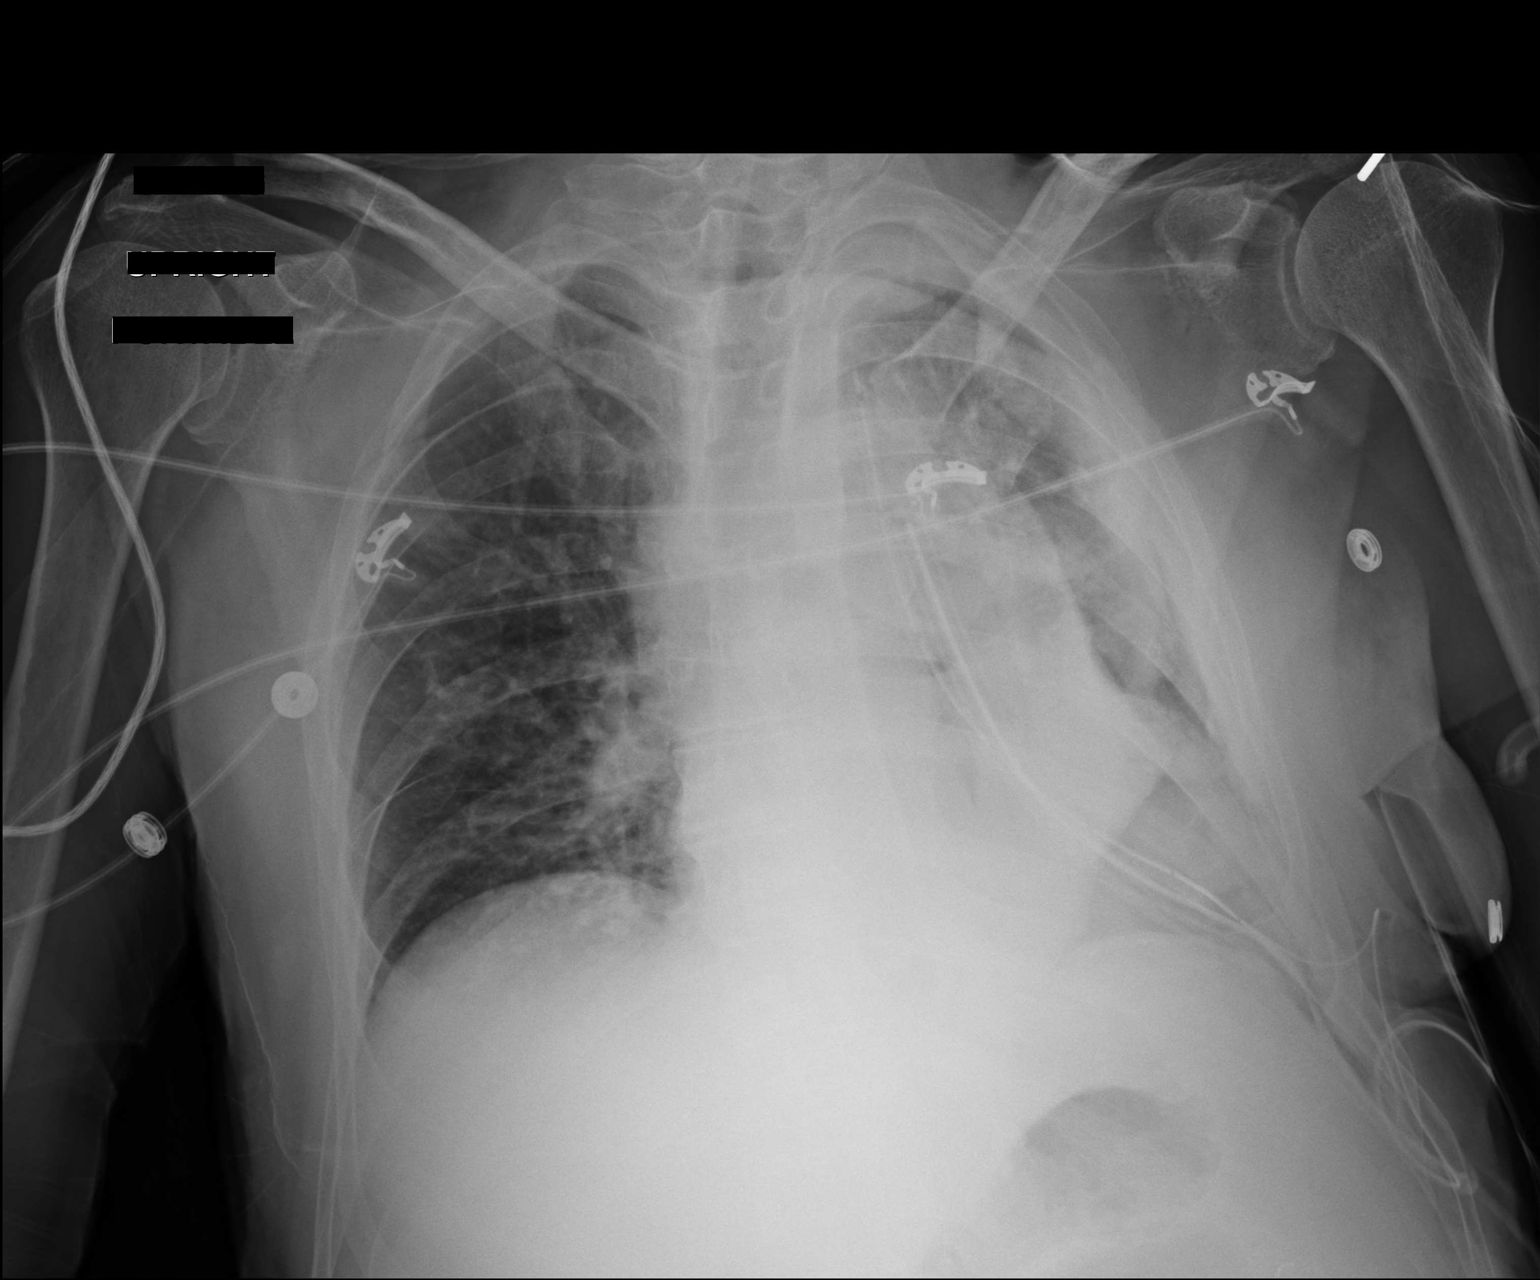

[1 of 1 positions shown; findings below may reference images not displayed]

FINDINGS: Stable cardiomediastinal silhouette. New left-sided chest tube and
PleurX catheter. Essentially resolved left-sided pleural effusion
with residual pleural thickening. Dense opacity in the lingula and
left lower lobe. Slightly increased lucency of the left costophrenic
angle may reflect small pneumothorax. The right lung is clear. No
acute osseous abnormality.
IMPRESSION: 1. Status post left pleural effusion drainage with new left-sided
chest tube PleurX catheter. Slightly increased lucency at the left
costophrenic angle may reflect small pneumothorax.
2. Dense opacity in the lingula and left lower lobe likely reflects
poor re-expansion of chronically collapsed lung.

## 2019-09-08 IMAGING — DX PORTABLE CHEST - 1 VIEW
1 series · 1 of 1 positions shown · non-contrast
Comparison: 05/27/2018

CLINICAL DATA: Postop left lung surgery. Malignant pleural
effusion.

EXAM:
PORTABLE CHEST 1 VIEW

[chest ap]
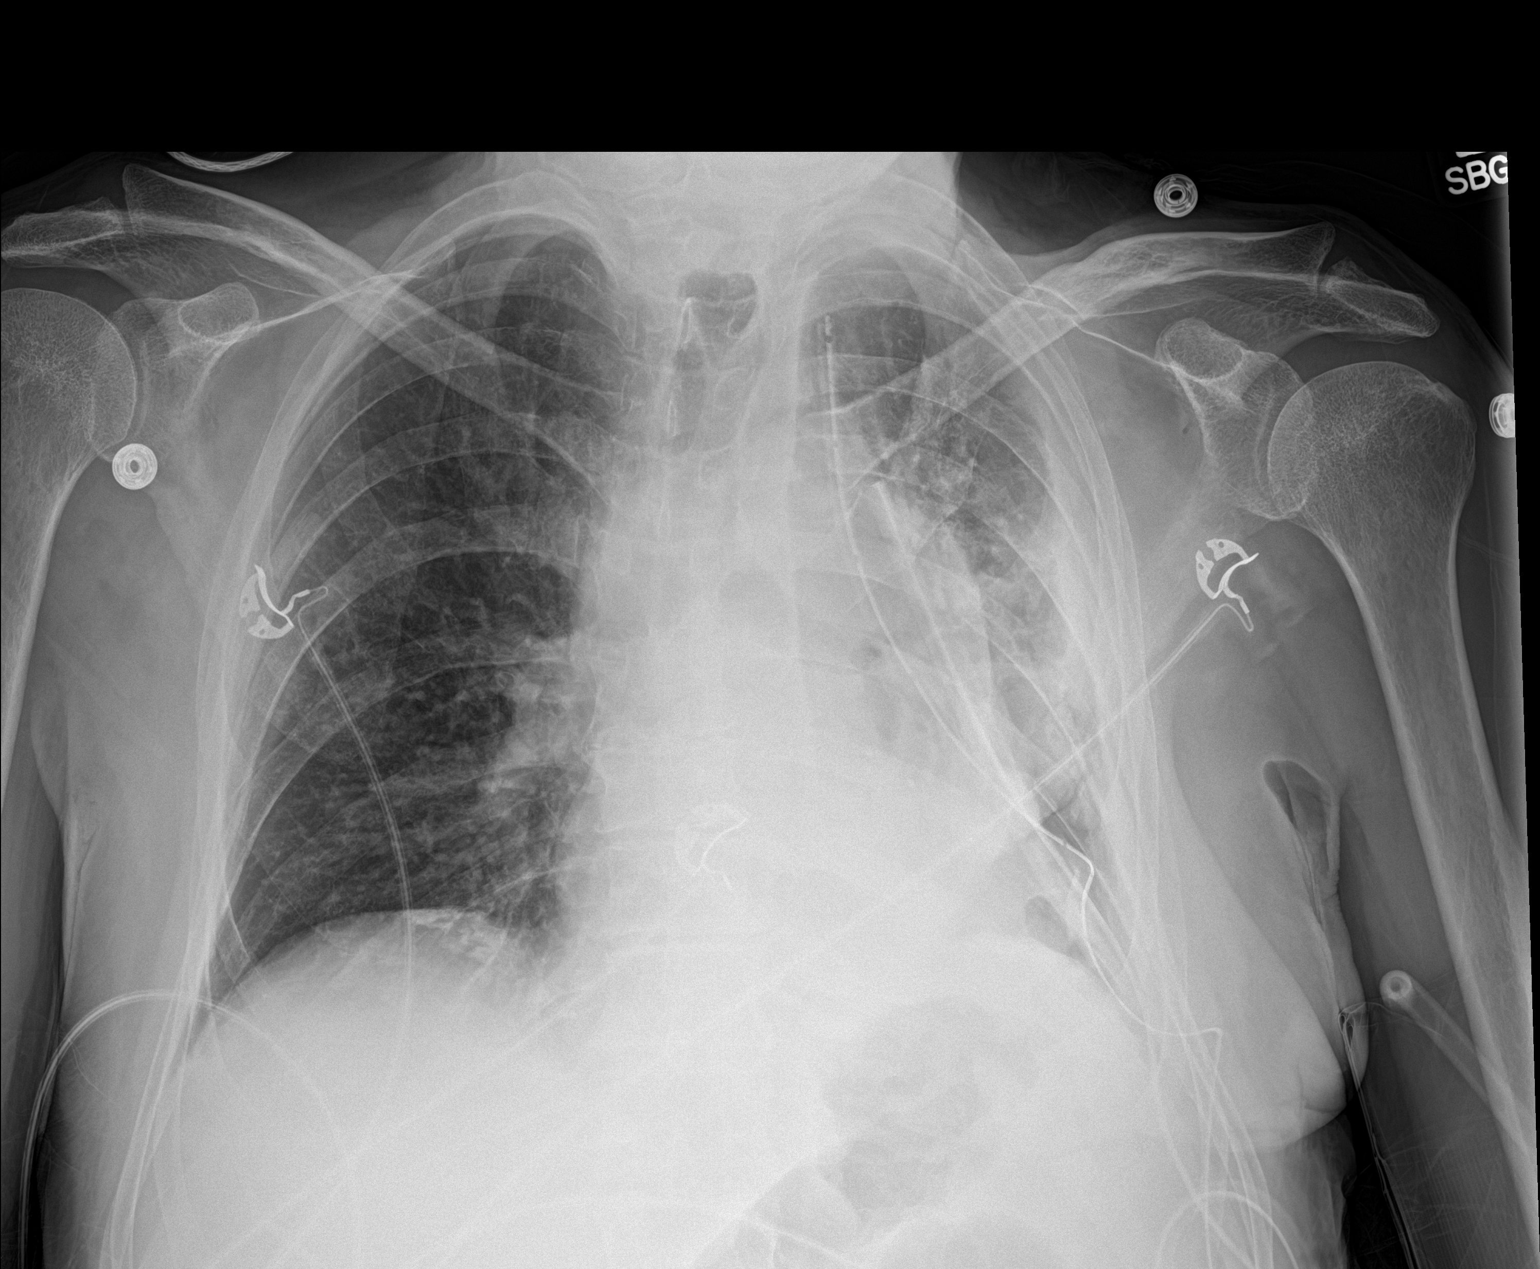

[1 of 1 positions shown; findings below may reference images not displayed]

FINDINGS: Two chest tubes on the left remain in place. Left basilar
pneumothorax appears slightly improved. Left lower lobe airspace
disease and pleural effusion with mild interval improvement of
aeration in the left base.

Right lung remains clear. Calcified pleural plaque along the right
hemidiaphragm. Negative for heart failure.
IMPRESSION: Two chest tubes remain on the left. Left basilar pneumothorax
slightly improved. Improved aeration left lung base.

## 2019-09-09 IMAGING — DX PORTABLE CHEST - 1 VIEW
1 series · 1 of 1 positions shown · non-contrast
Comparison: Chest x-ray 05/28/2018.

CLINICAL DATA: 77-year-old male status post chest tube placement.

EXAM:
PORTABLE CHEST 1 VIEW

[chest ap]
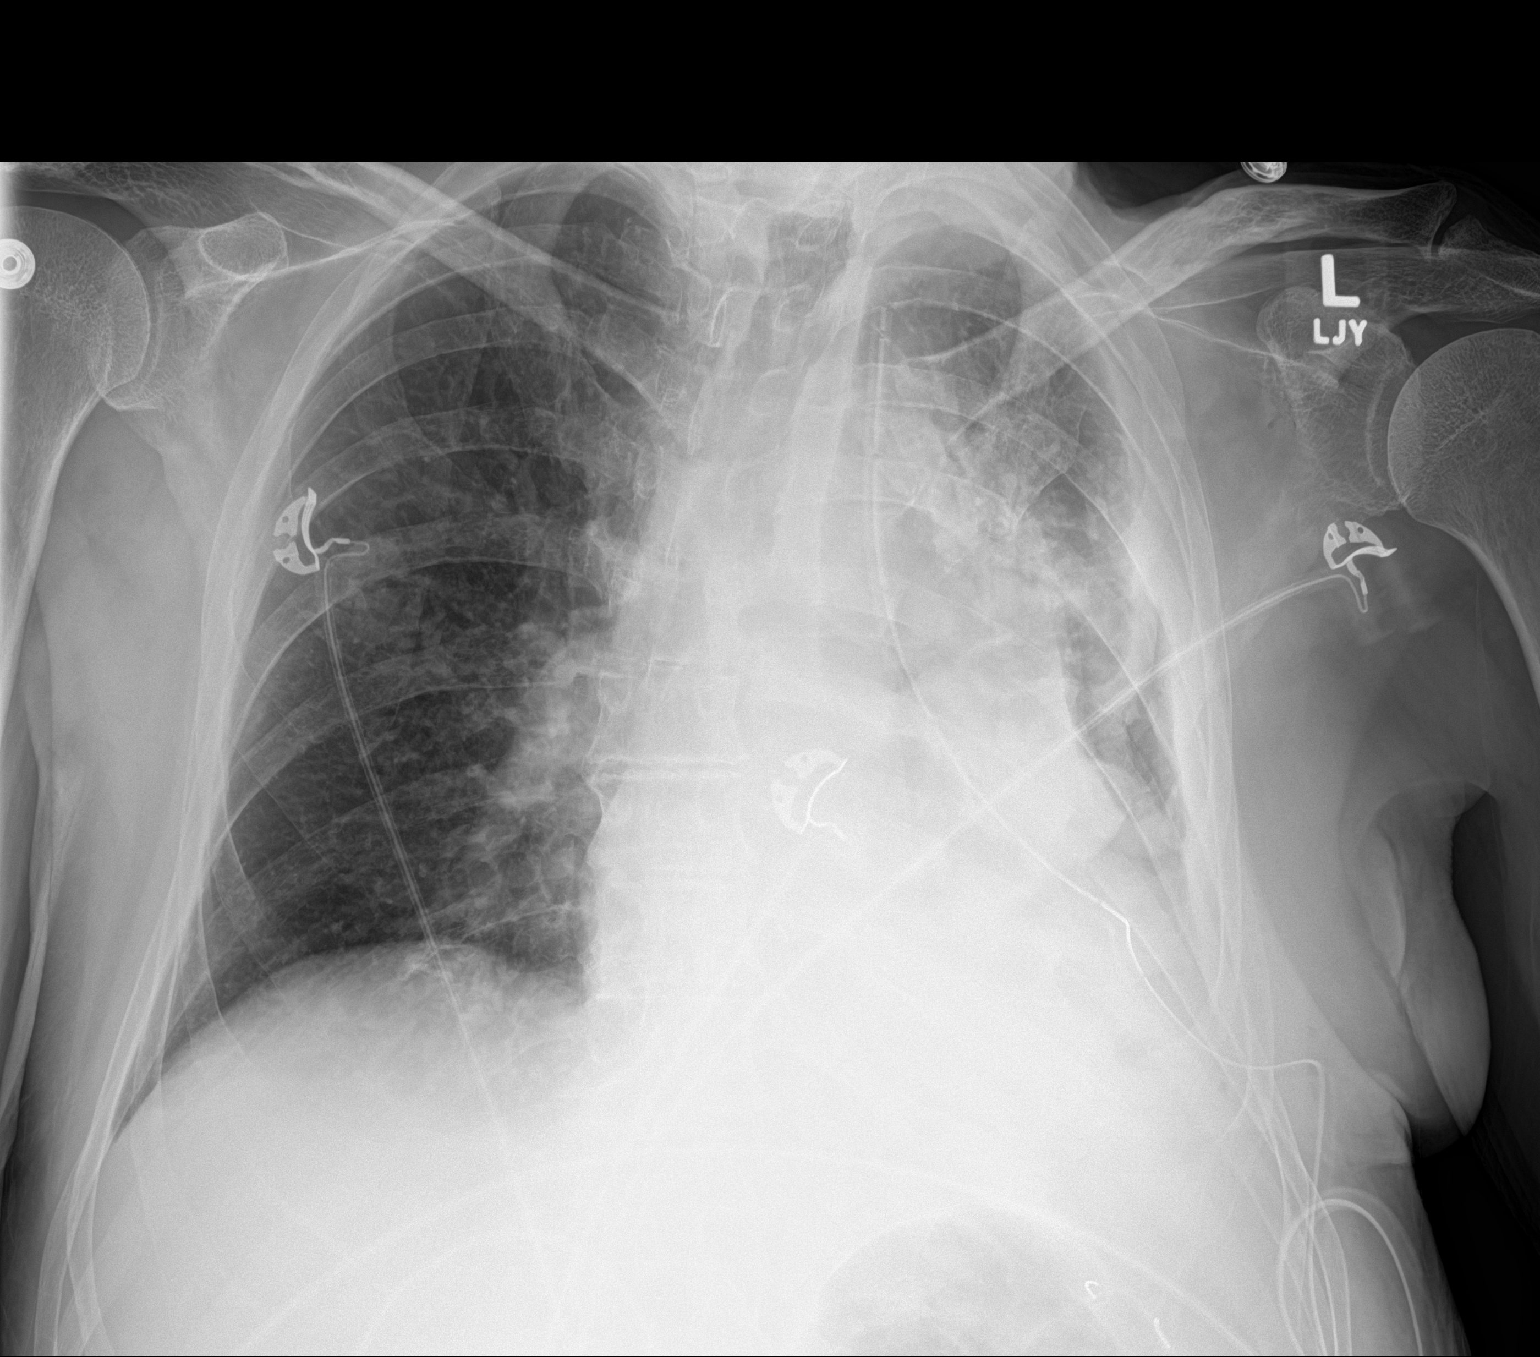

[1 of 1 positions shown; findings below may reference images not displayed]

FINDINGS: One of the previously noted left-sided chest tubes has been removed.
The other left-sided chest tube remains in stable position with tip
near the apex of the left hemithorax. There appears to be a small
amount of loculated left-sided pneumothorax in the mid to lower left
hemithorax, which appears stable to the prior study. Irregular
opacities throughout the left hemithorax likely reflect a
combination of residual pleural fluid and areas of atelectasis
and/or consolidation throughout the left lung. Right lung is clear.
No right pleural effusion. No evidence of pulmonary edema. Heart
size is normal. The patient is rotated to the right on today's exam,
resulting in distortion of the mediastinal contours and reduced
diagnostic sensitivity and specificity for mediastinal pathology.
IMPRESSION: 1. Support apparatus, as above.
2. Stable partially loculated left-sided hydropneumothorax. Stable
appearance of the left lung, which likely reflects significant areas
of residual atelectasis.

## 2022-04-14 ENCOUNTER — Telehealth: Payer: Self-pay

## 2022-04-14 NOTE — Telephone Encounter (Signed)
Received request for medical records from [facility] and [place]. Request along with cover sheet were faxed to Medical records. Nothing further needed a this time.
